# Patient Record
Sex: Female | Born: 1990 | Race: White | Hispanic: No | Marital: Married | Smoking: Never smoker
Health system: Southern US, Community
[De-identification: ages and names within clinical notes are randomized; demographics above are authoritative.]

## PROBLEM LIST (undated history)

## (undated) DIAGNOSIS — E039 Hypothyroidism, unspecified: Secondary | ICD-10-CM

## (undated) HISTORY — DX: Hypothyroidism, unspecified: E03.9

---

## 2008-02-03 HISTORY — PX: THYROIDECTOMY: SHX17

## 2012-02-03 HISTORY — PX: BANKART REPAIR: SHX5173

## 2015-02-03 HISTORY — PX: OTHER SURGICAL HISTORY: SHX169

## 2019-04-24 ENCOUNTER — Encounter: Payer: Self-pay | Admitting: Family Medicine

## 2019-04-27 ENCOUNTER — Ambulatory Visit: Payer: Self-pay | Attending: Internal Medicine

## 2019-04-27 DIAGNOSIS — Z23 Encounter for immunization: Secondary | ICD-10-CM

## 2019-04-27 NOTE — Progress Notes (Signed)
   Covid-19 Vaccination Clinic  Name:  Kathryn Fleming    MRN: [id number] DOB: May 26, 1990  04/27/2019  [name] was observed post Covid-19 immunization for 15 minutes without incident. She was provided with Vaccine Information Sheet and instruction to access the V-Safe system.   [name] was instructed to call 911 with any severe reactions post vaccine: Marland Kitchen Difficulty breathing  . Swelling of face and throat  . A fast heartbeat  . A bad rash all over body  . Dizziness and weakness   Immunizations Administered    Name Date Dose VIS Date Route   Moderna COVID-19 Vaccine 04/27/2019  8:27 AM 0.5 mL 01/03/2019 Intramuscular   Manufacturer: Moderna   Lot: [id number]   NDC: 99242-683-41

## 2019-05-31 ENCOUNTER — Ambulatory Visit: Payer: Self-pay

## 2019-06-05 ENCOUNTER — Encounter: Payer: Self-pay | Admitting: Student

## 2019-06-06 ENCOUNTER — Encounter: Payer: Self-pay | Admitting: *Deleted

## 2019-07-05 ENCOUNTER — Telehealth: Payer: Self-pay | Admitting: Student

## 2019-07-05 NOTE — Telephone Encounter (Signed)
Attempted to reach patient about making a new gyn appointment. Left a voicemail message so she can call to schedule if she still would like an appointment.

## 2019-09-27 ENCOUNTER — Other Ambulatory Visit: Payer: Self-pay

## 2019-09-27 ENCOUNTER — Other Ambulatory Visit (HOSPITAL_COMMUNITY)
Admission: RE | Admit: 2019-09-27 | Discharge: 2019-09-27 | Disposition: A | Discharge status: Home / Self Care | Payer: BC Managed Care – PPO | Source: Ambulatory Visit | Attending: Obstetrics and Gynecology | Admitting: Obstetrics and Gynecology

## 2019-09-27 ENCOUNTER — Encounter: Payer: Self-pay | Admitting: Obstetrics and Gynecology

## 2019-09-27 ENCOUNTER — Ambulatory Visit (INDEPENDENT_AMBULATORY_CARE_PROVIDER_SITE_OTHER): Payer: BC Managed Care – PPO | Admitting: Obstetrics and Gynecology

## 2019-09-27 VITALS — BP 118/71 | HR 100 | Ht 66.0 in | Wt 275.7 lb

## 2019-09-27 DIAGNOSIS — Z01419 Encounter for gynecological examination (general) (routine) without abnormal findings: Secondary | ICD-10-CM | POA: Insufficient documentation

## 2019-09-27 DIAGNOSIS — E890 Postprocedural hypothyroidism: Secondary | ICD-10-CM

## 2019-09-27 DIAGNOSIS — Z3201 Encounter for pregnancy test, result positive: Secondary | ICD-10-CM | POA: Diagnosis not present

## 2019-09-27 LAB — POCT PREGNANCY, URINE: Preg Test, Ur: POSITIVE — AB

## 2019-09-27 NOTE — Patient Instructions (Signed)
First Trimester of Pregnancy  The first trimester of pregnancy is from week 1 until the end of week 13 (months 1 through 3). During this time, your baby will begin to develop inside you. At 6-8 weeks, the eyes and face are formed, and the heartbeat can be seen on ultrasound. At the end of 12 weeks, all the baby's organs are formed. Prenatal care is all the medical care you receive before the birth of your baby. Make sure you get good prenatal care and follow all of your doctor's instructions. Follow these instructions at home: Medicines  Take over-the-counter and prescription medicines only as told by your doctor. Some medicines are safe and some medicines are not safe during pregnancy.  Take a prenatal vitamin that contains at least 600 micrograms (mcg) of folic acid.  If you have trouble pooping (constipation), take medicine that will make your stool soft (stool softener) if your doctor approves. Eating and drinking   Eat regular, healthy meals.  Your doctor will tell you the amount of weight gain that is right for you.  Avoid raw meat and uncooked cheese.  If you feel sick to your stomach (nauseous) or throw up (vomit): ? Eat 4 or 5 small meals a day instead of 3 large meals. ? Try eating a few soda crackers. ? Drink liquids between meals instead of during meals.  To prevent constipation: ? Eat foods that are high in fiber, like fresh fruits and vegetables, whole grains, and beans. ? Drink enough fluids to keep your pee (urine) clear or pale yellow. Activity  Exercise only as told by your doctor. Stop exercising if you have cramps or pain in your lower belly (abdomen) or low back.  Do not exercise if it is too hot, too humid, or if you are in a place of great height (high altitude).  Try to avoid standing for long periods of time. Move your legs often if you must stand in one place for a long time.  Avoid heavy lifting.  Wear low-heeled shoes. Sit and stand up  straight.  You can have sex unless your doctor tells you not to. Relieving pain and discomfort  Wear a good support bra if your breasts are sore.  Take warm water baths (sitz baths) to soothe pain or discomfort caused by hemorrhoids. Use hemorrhoid cream if your doctor says it is okay.  Rest with your legs raised if you have leg cramps or low back pain.  If you have puffy, bulging veins (varicose veins) in your legs: ? Wear support hose or compression stockings as told by your doctor. ? Raise (elevate) your feet for 15 minutes, 3-4 times a day. ? Limit salt in your food. Prenatal care  Schedule your prenatal visits by the twelfth week of pregnancy.  Write down your questions. Take them to your prenatal visits.  Keep all your prenatal visits as told by your doctor. This is important. Safety  Wear your seat belt at all times when driving.  Make a list of emergency phone numbers. The list should include numbers for family, friends, the hospital, and police and fire departments. General instructions  Ask your doctor for a referral to a local prenatal class. Begin classes no later than at the start of month 6 of your pregnancy.  Ask for help if you need counseling or if you need help with nutrition. Your doctor can give you advice or tell you where to go for help.  Do not use hot tubs, steam   rooms, or saunas.  Do not douche or use tampons or scented sanitary pads.  Do not cross your legs for long periods of time.  Avoid all herbs and alcohol. Avoid drugs that are not approved by your doctor.  Do not use any tobacco products, including cigarettes, chewing tobacco, and electronic cigarettes. If you need help quitting, ask your doctor. You may get counseling or other support to help you quit.  Avoid cat litter boxes and soil used by cats. These carry germs that can cause birth defects in the baby and can cause a loss of your baby (miscarriage) or stillbirth.  Visit your dentist.  At home, brush your teeth with a soft toothbrush. Be gentle when you floss. Contact a doctor if:  You are dizzy.  You have mild cramps or pressure in your lower belly.  You have a nagging pain in your belly area.  You continue to feel sick to your stomach, you throw up, or you have watery poop (diarrhea).  You have a bad smelling fluid coming from your vagina.  You have pain when you pee (urinate).  You have increased puffiness (swelling) in your face, hands, legs, or ankles. Get help right away if:  You have a fever.  You are leaking fluid from your vagina.  You have spotting or bleeding from your vagina.  You have very bad belly cramping or pain.  You gain or lose weight rapidly.  You throw up blood. It may look like coffee grounds.  You are around people who have German measles, fifth disease, or chickenpox.  You have a very bad headache.  You have shortness of breath.  You have any kind of trauma, such as from a fall or a car accident. Summary  The first trimester of pregnancy is from week 1 until the end of week 13 (months 1 through 3).  To take care of yourself and your unborn baby, you will need to eat healthy meals, take medicines only if your doctor tells you to do so, and do activities that are safe for you and your baby.  Keep all follow-up visits as told by your doctor. This is important as your doctor will have to ensure that your baby is healthy and growing well. This information is not intended to replace advice given to you by your health care provider. Make sure you discuss any questions you have with your health care provider. Document Revised: 05/12/2018 Document Reviewed: 01/28/2016 Elsevier Patient Education  2020 Elsevier Inc.  

## 2019-09-27 NOTE — Progress Notes (Signed)
   Subjective:    Patient ID: Kathryn Fleming, female    DOB: 21-Jul-1990, 29 y.o.   MRN: [id number]  HPI  29 yo G1P0 with LMP 08/24/2019 seen at [facility] with confirmed positive urine pregnancy test.  Pt will be rescheduled for formal new OB visit.  Discussion of hypothyroid secondary to thyroid resection.  Pt currently takes synthroid, but has not had labs or been followed by a practitioner for about a year.    Review of Systems     Objective:   Physical Exam SVE:  Normal appearing vagina and cervix. Pap smear taken without incident.  Cervix friable and light bleeding noted.  No abnormal discharge noted.       Assessment & Plan:   1. Gynecologic exam normal Pap smear taken - Cytology - PAP( [facility])  2. Postablative hypothyroidism Continue current dose of synthroid, will refer to endocrinology - TSH - T4 - Ambulatory referral to Endocrinology  3. Positive pregnancy test Continue prenatal vitamins New OB in 2-3 weeks Discussed appropriate antiemetics in early pregnancy.  The nature of [facility] [place] [facility] with multiple MDs and other Advanced Practice Providers was explained to patient; also emphasized that residents, students are part of our team.

## 2019-09-28 ENCOUNTER — Other Ambulatory Visit: Payer: Self-pay

## 2019-09-28 LAB — T4: T4, Total: 9.8 ug/dL (ref 4.5–12.0)

## 2019-09-28 LAB — TSH: TSH: 1.34 u[IU]/mL (ref 0.450–4.500)

## 2019-09-29 LAB — CERVICOVAGINAL ANCILLARY ONLY: HPV 16/18/45 genotyping: NEGATIVE

## 2019-10-02 LAB — CYTOLOGY - PAP
Chlamydia: NEGATIVE
Comment: NEGATIVE
Comment: NEGATIVE
Comment: NEGATIVE
Comment: NORMAL
Diagnosis: NEGATIVE
Diagnosis: REACTIVE
HPV 16: NEGATIVE
HPV 18 / 45: NEGATIVE
High risk HPV: POSITIVE — AB
Neisseria Gonorrhea: NEGATIVE

## 2019-10-20 ENCOUNTER — Ambulatory Visit (INDEPENDENT_AMBULATORY_CARE_PROVIDER_SITE_OTHER): Payer: BC Managed Care – PPO | Admitting: Obstetrics and Gynecology

## 2019-10-20 ENCOUNTER — Encounter: Payer: Self-pay | Admitting: Obstetrics and Gynecology

## 2019-10-20 ENCOUNTER — Other Ambulatory Visit: Payer: Self-pay

## 2019-10-20 VITALS — BP 104/71 | HR 87 | Wt 268.6 lb

## 2019-10-20 DIAGNOSIS — Z3A08 8 weeks gestation of pregnancy: Secondary | ICD-10-CM

## 2019-10-20 DIAGNOSIS — E039 Hypothyroidism, unspecified: Secondary | ICD-10-CM

## 2019-10-20 DIAGNOSIS — Z23 Encounter for immunization: Secondary | ICD-10-CM

## 2019-10-20 DIAGNOSIS — Z3490 Encounter for supervision of normal pregnancy, unspecified, unspecified trimester: Secondary | ICD-10-CM | POA: Insufficient documentation

## 2019-10-20 DIAGNOSIS — Z3400 Encounter for supervision of normal first pregnancy, unspecified trimester: Secondary | ICD-10-CM

## 2019-10-20 LAB — POCT URINALYSIS DIP (DEVICE)
Glucose, UA: NEGATIVE mg/dL
Ketones, ur: 80 mg/dL — AB
Leukocytes,Ua: NEGATIVE
Nitrite: NEGATIVE
Protein, ur: 30 mg/dL — AB
Specific Gravity, Urine: >=1.03 (ref 1.005–1.030)
Urobilinogen, UA: 1 mg/dL (ref 0.0–1.0)
pH: 6 (ref 5.0–8.0)

## 2019-10-20 MED ORDER — BLOOD PRESSURE KIT DEVI: 1.0000 | 0 refills | Status: DC | PRN

## 2019-10-20 NOTE — Progress Notes (Signed)
Patient still undecided about preferred pharmacy, will let us know at a later date.

## 2019-10-20 NOTE — Progress Notes (Signed)
INITIAL PRENATAL VISIT NOTE  Subjective:  Kathryn Fleming is a 29 y.o. G1P0000 at [redacted]w[redacted]d by LMP being seen today for her initial prenatal visit. This is a planned pregnancy. She and partner are happy with the pregnancy. She was using nothing for birth control previously. She has an obstetric history significant for n/a. She has a medical history significant for surgically induced hypothyrodism.  Patient reports nausea.  Contractions: Not present. Vag. Bleeding: None.  Movement: Absent. Denies leaking of fluid.   Past Medical History:  Diagnosis Date  . Hypothyroidism    Past Surgical History:  Procedure Laterality Date  . BANKART REPAIR  2014  . latarjet repair  2017  . THYROIDECTOMY  2010   OB History  Gravida Para Term Preterm AB Living  1 0 0 0 0 0  SAB TAB Ectopic Multiple Live Births  0 0 0 0 0    # Outcome Date GA Lbr Len/2nd Weight Sex Delivery Anes PTL Lv  1 Current             Social History   Socioeconomic History  . Marital status: Married    Spouse name: Not on file  . Number of children: Not on file  . Years of education: Not on file  . Highest education level: Not on file  Occupational History  . Not on file  Tobacco Use  . Smoking status: Never Smoker  . Smokeless tobacco: Never Used  Vaping Use  . Vaping Use: Never used  Substance and Sexual Activity  . Alcohol use: Not Currently  . Drug use: Never  . Sexual activity: Yes    Birth control/protection: None  Other Topics Concern  . Not on file  Social History Narrative  . Not on file   Social Determinants of Health   Financial Resource Strain:   . Difficulty of Paying Living Expenses: Not on file  Food Insecurity:   . Worried About Charity fundraiser in the Last Year: Not on file  . Ran Out of Food in the Last Year: Not on file  Transportation Needs:   . Lack of Transportation (Medical): Not on file  . Lack of Transportation (Non-Medical): Not on file  Physical Activity:   . Days of  Exercise per Week: Not on file  . Minutes of Exercise per Session: Not on file  Stress:   . Feeling of Stress : Not on file  Social Connections:   . Frequency of Communication with Friends and Family: Not on file  . Frequency of Social Gatherings with Friends and Family: Not on file  . Attends Religious Services: Not on file  . Active Member of Clubs or Organizations: Not on file  . Attends Archivist Meetings: Not on file  . Marital Status: Not on file    Family History  Problem Relation Age of Onset  . Multiple sclerosis Mother   . Hypertension Mother   . Obesity Mother   . Heart disease Father   . Diabetes Father   . Atrial fibrillation Father   . Obesity Father   . Kidney cancer Father      Current Outpatient Medications:  .  levothyroxine (SYNTHROID) 200 MCG tablet, Take 200 mcg by mouth daily before breakfast., Disp: , Rfl:  .  Prenatal Vit-Fe Fumarate-FA (PRENATAL MULTIVITAMIN) TABS tablet, Take 1 tablet by mouth daily at 12 noon., Disp: , Rfl:  .  Blood Pressure Monitoring (BLOOD PRESSURE KIT) DEVI, 1 Device by Does not apply  route as needed. Monitor BP weekly: low risk z34.90, Disp: 1 each, Rfl: 0  Allergies  Allergen Reactions  . Levothyroxine Rash   Review of Systems: Negative except for what is mentioned in HPI.  Objective:   Vitals:   10/20/19 1043  BP: 104/71  Pulse: 87  Weight: 268 lb 9.6 oz (121.8 kg)    Fetal Status: Fetal Heart Rate (bpm): 176 (U/S)   Movement: Absent     Physical Exam: BP 104/71   Pulse 87   Wt 268 lb 9.6 oz (121.8 kg)   LMP 08/24/2019 (Exact Date)   BMI 43.35 kg/m  CONSTITUTIONAL: Well-developed, well-nourished female in no acute distress.  NEUROLOGIC: Alert and oriented to person, place, and time. Normal reflexes, muscle tone coordination. No cranial nerve deficit noted. PSYCHIATRIC: Normal mood and affect. Normal behavior. Normal judgment and thought content. SKIN: Skin is warm and dry. No rash noted. Not  diaphoretic. No erythema. No pallor. HENT:  Normocephalic, atraumatic, External right and left ear normal. Oropharynx is clear and moist EYES: Conjunctivae and EOM are normal. Pupils are equal, round, and reactive to light. No scleral icterus.  NECK: Normal range of motion, supple, no masses CARDIOVASCULAR: Normal heart rate noted, regular rhythm RESPIRATORY: Effort normal, no problems with respiration noted BREASTS: not examined  ABDOMEN: Soft, nondistended GU: deferred MUSCULOSKELETAL: Normal range of motion. EXT:  No edema and no tenderness.   Assessment and Plan:  Pregnancy: G1P0000 at [redacted]w[redacted]d by LMP  1. Supervision of low-risk first pregnancy, unspecified trimester - [place] today showed CRL of 7w, will get dating [place] - Reviewed [facility] structure, multiple providers, fellows, medical students, virtual visits, MyChart.  - [facility] AMB BABYSCRIPTS OPT IN - Culture, OB Urine - CBC/D/Plt+RPR+Rh+ABO+Rub Ab... - Hemoglobin A1c - US OB Transvaginal; Future - [place] MFM OB DETAIL +14 WK; Future - doing well with unisom, call if nausea worsens - has had COVID vaccine  2. Supervision of low-risk pregnancy, unspecified trimester  3. [redacted] weeks gestation of pregnancy  4. Acquired hypothyroidism - Surgically induced - On levothyroxine 200 mcg now - She is seeing endocrine next week, normal labs currently - Will likely need increase in dose, await endo guidelines   Preterm labor symptoms and general obstetric precautions including but not limited to vaginal bleeding, contractions, leaking of fluid and fetal movement were reviewed in detail with the patient.  Please refer to After Visit Summary for other counseling recommendations.   Return in about 4 weeks (around 11/17/2019) for in person, high OB.  Sloan Leiter 10/20/2019 11:45 AM

## 2019-10-21 LAB — CBC/D/PLT+RPR+RH+ABO+RUB AB...
Antibody Screen: NEGATIVE
Basophils Absolute: 0 10*3/uL (ref 0.0–0.2)
Basos: 0 %
EOS (ABSOLUTE): 0 10*3/uL (ref 0.0–0.4)
Eos: 0 %
HCV Ab: <0.1 s/co ratio (ref 0.0–0.9)
HIV Screen 4th Generation wRfx: NONREACTIVE
Hematocrit: 45.2 % (ref 34.0–46.6)
Hemoglobin: 15.3 g/dL (ref 11.1–15.9)
Hepatitis B Surface Ag: NEGATIVE
Immature Grans (Abs): 0 10*3/uL (ref 0.0–0.1)
Immature Granulocytes: 0 %
Lymphocytes Absolute: 2 10*3/uL (ref 0.7–3.1)
Lymphs: 23 %
MCH: 30 pg (ref 26.6–33.0)
MCHC: 33.8 g/dL (ref 31.5–35.7)
MCV: 89 fL (ref 79–97)
Monocytes Absolute: 0.6 10*3/uL (ref 0.1–0.9)
Monocytes: 7 %
Neutrophils Absolute: 5.9 10*3/uL (ref 1.4–7.0)
Neutrophils: 70 %
Platelets: 366 10*3/uL (ref 150–450)
RBC: 5.1 x10E6/uL (ref 3.77–5.28)
RDW: 12.5 % (ref 11.7–15.4)
RPR Ser Ql: NONREACTIVE
Rh Factor: POSITIVE
Rubella Antibodies, IGG: 1.65 index (ref >0.99)
WBC: 8.6 10*3/uL (ref 3.4–10.8)

## 2019-10-21 LAB — HEMOGLOBIN A1C
Est. average glucose Bld gHb Est-mCnc: 103 mg/dL
Hgb A1c MFr Bld: 5.2 % (ref 4.8–5.6)

## 2019-10-21 LAB — HCV INTERPRETATION

## 2019-10-23 ENCOUNTER — Other Ambulatory Visit: Payer: Self-pay | Admitting: General Practice

## 2019-10-23 DIAGNOSIS — Z3490 Encounter for supervision of normal pregnancy, unspecified, unspecified trimester: Secondary | ICD-10-CM

## 2019-10-23 LAB — CULTURE, OB URINE

## 2019-10-23 LAB — URINE CULTURE, OB REFLEX

## 2019-10-25 ENCOUNTER — Telehealth: Payer: Self-pay | Admitting: *Deleted

## 2019-10-25 ENCOUNTER — Encounter: Payer: Self-pay | Admitting: Family Medicine

## 2019-10-25 NOTE — Telephone Encounter (Signed)
Voicemail message left by pt stating that she needs to get some dental work done. She requests a release letter to be faxed to her dentist. Fax #  [phone]. Pt also requested to be notified once the request has been completed or if it will not be possible.

## 2019-10-26 ENCOUNTER — Other Ambulatory Visit: Payer: BC Managed Care – PPO

## 2019-10-26 ENCOUNTER — Other Ambulatory Visit: Payer: Self-pay

## 2019-10-26 DIAGNOSIS — Z3490 Encounter for supervision of normal pregnancy, unspecified, unspecified trimester: Secondary | ICD-10-CM

## 2019-10-27 ENCOUNTER — Encounter: Payer: Self-pay | Admitting: Internal Medicine

## 2019-10-27 ENCOUNTER — Ambulatory Visit (INDEPENDENT_AMBULATORY_CARE_PROVIDER_SITE_OTHER): Payer: BC Managed Care – PPO | Admitting: Internal Medicine

## 2019-10-27 VITALS — BP 108/68 | HR 105 | Ht 66.0 in | Wt 266.8 lb

## 2019-10-27 DIAGNOSIS — E890 Postprocedural hypothyroidism: Secondary | ICD-10-CM | POA: Diagnosis not present

## 2019-10-27 LAB — GLUCOSE TOLERANCE, 2 HOURS W/ 1HR
Glucose, 1 hour: 113 mg/dL (ref 65–179)
Glucose, 2 hour: 101 mg/dL (ref 65–152)
Glucose, Fasting: 85 mg/dL (ref 65–91)

## 2019-10-27 NOTE — Patient Instructions (Signed)

## 2019-10-27 NOTE — Progress Notes (Signed)
Name: Kathryn Fleming  MRN/ DOB: [id number], [birth year]    Age/ Sex: 29 y.o., female    PCP: Patient, No Pcp Per   Reason for Endocrinology Evaluation: Hypothyroidism     Date of Initial Endocrinology Evaluation: 10/27/2019     HPI: Kathryn Fleming is a 29 y.o. female with a past medical history of Hypothyrodism. The patient presented for initial endocrinology clinic visit on 10/27/2019 for consultative assistance with her Hypothyroidism.     She is S/P thyroidectomy secondary to [place] in 2010 with benign pathology    Pt is 9.[redacted] weeks gestation and has sickness , not a  Lot of vomiting   Takes Synthroid, takes it appropriately , has been on Synthroid 200 mcg   She is a psych NP   HISTORY:  Past Medical History:  Past Medical History:  Diagnosis Date   Hypothyroidism    Past Surgical History:  Past Surgical History:  Procedure Laterality Date   BANKART REPAIR  2014   latarjet repair  2017   THYROIDECTOMY  2010      Social History:  reports that she has never smoked. She has never used smokeless tobacco. She reports previous alcohol use. She reports that she does not use drugs.  Family History: family history includes Atrial fibrillation in her father; Diabetes in her father; Heart disease in her father; Hypertension in her mother; Kidney cancer in her father; Multiple sclerosis in her mother; Obesity in her father and mother.   HOME MEDICATIONS: Allergies as of 10/27/2019      Reactions   Levothyroxine Rash      Medication List       Accurate as of October 27, 2019 11:59 AM. If you have any questions, ask your nurse or doctor.        Blood Pressure Kit Devi 1 Device by Does not apply route as needed. Monitor BP weekly: low risk z34.90   prenatal multivitamin Tabs tablet Take 1 tablet by mouth daily at 12 noon.   Synthroid 200 MCG tablet Generic drug: levothyroxine Take 200 mcg by mouth daily before breakfast. Take 1 tablet by mouth once daily.  Dispense as written. What changed: Another medication with the same name was removed. Continue taking this medication, and follow the directions you see here. Changed by: Dorita Sciara, MD         REVIEW OF SYSTEMS: A comprehensive ROS was conducted with the patient and is negative except as per HPI and below:  ROS     OBJECTIVE:  VS: BP 108/68 (BP Location: Left Arm, Patient Position: Sitting, Cuff Size: Normal)    Pulse (!) 105    Ht $R'5\' 6"'jR$  (1.676 m)    Wt 266 lb 12.8 oz (121 kg)    LMP 08/24/2019 (Exact Date)    SpO2 97%    BMI 43.06 kg/m    Wt Readings from Last 3 Encounters:  10/27/19 266 lb 12.8 oz (121 kg)  10/20/19 268 lb 9.6 oz (121.8 kg)  09/27/19 275 lb 11.2 oz (125.1 kg)     EXAM: General: Pt appears well and is in NAD  Neck: General: Supple without adenopathy. Thyroid: Thyroid size normal.  No goiter or nodules appreciated. No thyroid bruit.  Lungs: Clear with good BS bilat with no rales, rhonchi, or wheezes  Heart: Auscultation: RRR.  Abdomen: Normoactive bowel sounds, soft, nontender, without masses or organomegaly palpable  Extremities:  BL LE: No pretibial edema normal ROM and strength.  Skin: Hair:  Texture and amount normal with gender appropriate distribution Skin Inspection: No rashes Skin Palpation: Skin temperature, texture, and thickness normal to palpation  Neuro: Cranial nerves: II - XII grossly intact  Motor: Normal strength throughout DTRs: 2+ and symmetric in UE without delay in relaxation phase  Mental Status: Judgment, insight: Intact Orientation: Oriented to time, place, and person Mood and affect: No depression, anxiety, or agitation     DATA REVIEWED:   Results for HERO, MCCATHERN (MRN [id number]) as of 10/30/2019 12:16  Ref. Range 10/27/2019 12:06  TSH Latest Ref Range: 0.35 - 4.50 uIU/mL 0.75    ASSESSMENT/PLAN/RECOMMENDATIONS:   1. Post- surgical hypothyroidism, patient in the first trimester of pregnancy:  -Patient is  clinically euthyroid -No local neck symptoms - - Pt educated extensively on the correct way to take levothyroxine (first thing in the morning with water, 30 minutes before eating or taking other medications). - Pt encouraged to double dose the following day if she were to miss a dose given long half-life of levothyroxine. -Repeat TSH  today is normal    Medications : Continue Levothyroxine 200 MCG daily   Follow-up in 6 weeks  Signed electronically by: Mack Guise, MD  [facility] End[facility]in[facility]  [facility] [address] [place], [place] [address] Phone: [phone] FAX: [phone]   CC: Patient, No Pcp Per No address on file Phone: None Fax: None   Return to Endocrinology clinic as below: Future Appointments  Date Time Provider [place]  10/31/2019  9:00 AM [facility] US1 [facility]  11/17/2019  8:35 AM Danielle Rankin [facility]  01/04/2020  9:45 AM [facility] US4 [facility]

## 2019-10-30 ENCOUNTER — Telehealth: Payer: Self-pay

## 2019-10-30 DIAGNOSIS — E890 Postprocedural hypothyroidism: Secondary | ICD-10-CM | POA: Insufficient documentation

## 2019-10-30 LAB — TSH: TSH: 0.75 u[IU]/mL (ref 0.35–4.50)

## 2019-10-30 NOTE — Telephone Encounter (Signed)
Mailed [facility] REQ & AUTH for medical records for [facility] to pt prior endocrinologist:  Dr. Earley Brooke @ [facility].  [address] [place] [address].

## 2019-10-31 ENCOUNTER — Telehealth: Payer: Self-pay | Admitting: General Practice

## 2019-10-31 ENCOUNTER — Ambulatory Visit
Admission: RE | Admit: 2019-10-31 | Discharge: 2019-10-31 | Disposition: A | Discharge status: Home / Self Care | Payer: BC Managed Care – PPO | Source: Ambulatory Visit | Attending: Obstetrics and Gynecology | Admitting: Obstetrics and Gynecology

## 2019-10-31 ENCOUNTER — Other Ambulatory Visit: Payer: Self-pay

## 2019-10-31 ENCOUNTER — Other Ambulatory Visit: Payer: Self-pay | Admitting: Obstetrics and Gynecology

## 2019-10-31 DIAGNOSIS — O3680X0 Pregnancy with inconclusive fetal viability, not applicable or unspecified: Secondary | ICD-10-CM

## 2019-10-31 DIAGNOSIS — Z3400 Encounter for supervision of normal first pregnancy, unspecified trimester: Secondary | ICD-10-CM

## 2019-10-31 DIAGNOSIS — Z87448 Personal history of other diseases of urinary system: Secondary | ICD-10-CM

## 2019-10-31 NOTE — Progress Notes (Signed)
Ob and renal US ordered

## 2019-10-31 NOTE — Telephone Encounter (Signed)
Scheduled Ultrasound for 10/6 @ 1245. Called & informed patient. Patient verbalized understanding and stated she called last week about a dental clearance letter being faxed but she never heard back from anyone. Informed patient fax was sent and apologized no one had called her back. Patient verbalized understanding & had no questions.

## 2019-11-01 ENCOUNTER — Other Ambulatory Visit: Payer: Self-pay | Admitting: *Deleted

## 2019-11-01 MED ORDER — ONDANSETRON 4 MG PO TBDP: 4.0000 mg | ORAL_TABLET | Freq: Four times a day (QID) | ORAL | 0 refills | Status: DC | PRN

## 2019-11-08 ENCOUNTER — Other Ambulatory Visit: Payer: Self-pay | Admitting: Obstetrics and Gynecology

## 2019-11-08 ENCOUNTER — Ambulatory Visit
Admission: RE | Admit: 2019-11-08 | Discharge: 2019-11-08 | Disposition: A | Discharge status: Home / Self Care | Payer: BC Managed Care – PPO | Source: Ambulatory Visit | Attending: Obstetrics and Gynecology | Admitting: Obstetrics and Gynecology

## 2019-11-08 ENCOUNTER — Other Ambulatory Visit: Payer: Self-pay

## 2019-11-08 DIAGNOSIS — Z3400 Encounter for supervision of normal first pregnancy, unspecified trimester: Secondary | ICD-10-CM | POA: Diagnosis present

## 2019-11-08 DIAGNOSIS — Z87448 Personal history of other diseases of urinary system: Secondary | ICD-10-CM | POA: Insufficient documentation

## 2019-11-08 DIAGNOSIS — Z3490 Encounter for supervision of normal pregnancy, unspecified, unspecified trimester: Secondary | ICD-10-CM

## 2019-11-17 ENCOUNTER — Other Ambulatory Visit: Payer: Self-pay | Admitting: Obstetrics & Gynecology

## 2019-11-17 ENCOUNTER — Encounter: Payer: Self-pay | Admitting: Medical

## 2019-11-17 ENCOUNTER — Other Ambulatory Visit: Payer: Self-pay

## 2019-11-17 ENCOUNTER — Ambulatory Visit (INDEPENDENT_AMBULATORY_CARE_PROVIDER_SITE_OTHER): Payer: BC Managed Care – PPO | Admitting: Medical

## 2019-11-17 VITALS — BP 114/74 | HR 96 | Wt 258.8 lb

## 2019-11-17 DIAGNOSIS — Z3490 Encounter for supervision of normal pregnancy, unspecified, unspecified trimester: Secondary | ICD-10-CM

## 2019-11-17 DIAGNOSIS — O219 Vomiting of pregnancy, unspecified: Secondary | ICD-10-CM

## 2019-11-17 DIAGNOSIS — E890 Postprocedural hypothyroidism: Secondary | ICD-10-CM

## 2019-11-17 MED ORDER — DOCUSATE SODIUM 250 MG PO CAPS: 250.0000 mg | ORAL_CAPSULE | Freq: Every day | ORAL | 0 refills | Status: DC | PRN

## 2019-11-17 MED ORDER — ONDANSETRON 4 MG PO TBDP: 4.0000 mg | ORAL_TABLET | Freq: Four times a day (QID) | ORAL | 0 refills | Status: DC | PRN

## 2019-11-17 MED ORDER — PROMETHAZINE HCL 25 MG PO TABS: 25.0000 mg | ORAL_TABLET | Freq: Four times a day (QID) | ORAL | 1 refills | Status: DC | PRN

## 2019-11-17 MED ORDER — FAMOTIDINE 20 MG PO TABS: 20.0000 mg | ORAL_TABLET | Freq: Every day | ORAL | 1 refills | Status: DC

## 2019-11-17 MED ORDER — SCOPOLAMINE 1 MG/3DAYS TD PT72: 1.0000 | MEDICATED_PATCH | TRANSDERMAL | 2 refills | Status: DC

## 2019-11-17 MED ORDER — ONDANSETRON HCL 4 MG PO TABS: 4.0000 mg | ORAL_TABLET | Freq: Four times a day (QID) | ORAL | 2 refills | Status: DC

## 2019-11-17 NOTE — Patient Instructions (Signed)
Hyperemesis Gravidarum Hyperemesis gravidarum is a severe form of nausea and vomiting that happens during pregnancy. Hyperemesis is worse than morning sickness. It may cause you to have nausea or vomiting all day for many days. It may keep you from eating and drinking enough food and liquids, which can lead to dehydration, malnutrition, and weight loss. Hyperemesis usually occurs during the first half (the first 20 weeks) of pregnancy. It often goes away once a woman is in her second half of pregnancy. However, sometimes hyperemesis continues through an entire pregnancy. What are the causes? The cause of this condition is not known. It may be related to changes in chemicals (hormones) in the body during pregnancy, such as the high level of pregnancy hormone (human chorionic gonadotropin) or the increase in the female sex hormone (estrogen). What are the signs or symptoms? Symptoms of this condition include:  Nausea that does not go away.  Vomiting that does not allow you to keep any food down.  Weight loss.  Body fluid loss (dehydration).  Having no desire to eat, or not liking food that you have previously enjoyed. How is this diagnosed? This condition may be diagnosed based on:  A physical exam.  Your medical history.  Your symptoms.  Blood tests.  Urine tests. How is this treated? This condition is managed by controlling symptoms. This may include:  Following an eating plan. This can help lessen nausea and vomiting.  Taking prescription medicines. An eating plan and medicines are often used together to help control symptoms. If medicines do not help relieve nausea and vomiting, you may need to receive fluids through an IV at the hospital. Follow these instructions at home: Eating and drinking   Avoid the following: ? Drinking fluids with meals. Try not to drink anything during the 30 minutes before and after your meals. ? Drinking more than 1 cup of fluid at a  time. ? Eating foods that trigger your symptoms. These may include spicy foods, coffee, high-fat foods, very sweet foods, and acidic foods. ? Skipping meals. Nausea can be more intense on an empty stomach. If you cannot tolerate food, do not force it. Try sucking on ice chips or other frozen items and make up for missed calories later. ? Lying down within 2 hours after eating. ? Being exposed to environmental triggers. These may include food smells, smoky rooms, closed spaces, rooms with strong smells, warm or humid places, overly loud and noisy rooms, and rooms with motion or flickering lights. Try eating meals in a well-ventilated area that is free of strong smells. ? Quick and sudden changes in your movement. ? Taking iron pills and multivitamins that contain iron. If you take prescription iron pills, do not stop taking them unless your health care provider approves. ? Preparing food. The smell of food can spoil your appetite or trigger nausea.  To help relieve your symptoms: ? Listen to your body. Everyone is different and has different preferences. Find what works best for you. ? Eat and drink slowly. ? Eat 5-6 small meals daily instead of 3 large meals. Eating small meals and snacks can help you avoid an empty stomach. ? In the morning, before getting out of bed, eat a couple of crackers to avoid moving around on an empty stomach. ? Try eating starchy foods as these are usually tolerated well. Examples include cereal, toast, bread, potatoes, pasta, rice, and pretzels. ? Include at least 1 serving of protein with your meals and snacks. Protein options include   lean meats, poultry, seafood, beans, nuts, nut butters, eggs, cheese, and yogurt. ? Try eating a protein-rich snack before bed. Examples of a protein-rick snack include cheese and crackers or a peanut butter sandwich made with 1 slice of whole-wheat bread and 1 tsp (5 g) of peanut butter. ? Eat or suck on things that have ginger in them.  It may help relieve nausea. Add  tsp ground ginger to hot tea or choose ginger tea. ? Try drinking 100% fruit juice or an electrolyte drink. An electrolyte drink contains sodium, potassium, and chloride. ? Drink fluids that are cold, clear, and carbonated or sour. Examples include lemonade, ginger ale, lemon-lime soda, ice water, and sparkling water. ? Brush your teeth or use a mouth rinse after meals. ? Talk with your health care provider about starting a supplement of vitamin B6. General instructions  Take over-the-counter and prescription medicines only as told by your health care provider.  Follow instructions from your health care provider about eating or drinking restrictions.  Continue to take your prenatal vitamins as told by your health care provider. If you are having trouble taking your prenatal vitamins, talk with your health care provider about different options.  Keep all follow-up and pre-birth (prenatal) visits as told by your health care provider. This is important. Contact a health care provider if:  You have pain in your abdomen.  You have a severe headache.  You have vision problems.  You are losing weight.  You feel weak or dizzy. Get help right away if:  You cannot drink fluids without vomiting.  You vomit blood.  You have constant nausea and vomiting.  You are very weak.  You faint.  You have a fever and your symptoms suddenly get worse. Summary  Hyperemesis gravidarum is a severe form of nausea and vomiting that happens during pregnancy.  Making some changes to your eating habits may help relieve nausea and vomiting.  This condition may be managed with medicine.  If medicines do not help relieve nausea and vomiting, you may need to receive fluids through an IV at the hospital. This information is not intended to replace advice given to you by your health care provider. Make sure you discuss any questions you have with your health care  provider. Document Revised: 02/08/2017 Document Reviewed: 09/18/2015 Elsevier Patient Education  2020 [employer]  

## 2019-11-17 NOTE — Progress Notes (Signed)
PRENATAL VISIT NOTE  Subjective:  Kathryn Fleming is a 29 y.o. G1P0000 at [redacted]w[redacted]d being seen today for ongoing prenatal care.  She is currently monitored for the following issues for this low-risk pregnancy and has Positive pregnancy test; Supervision of low-risk pregnancy, unspecified trimester; Hypothyroidism; and Postsurgical hypothyroidism on their problem list.  Patient reports nausea, vomiting and constipation.  Contractions: Not present. Vag. Bleeding: None.  Movement: Absent. Denies leaking of fluid.   The following portions of the patient's history were reviewed and updated as appropriate: allergies, current medications, past family history, past medical history, past social history, past surgical history and problem list.   Objective:   Vitals:   11/17/19 0854  BP: 114/74  Pulse: 96  Weight: 258 lb 12.8 oz (117.4 kg)    Fetal Status: Fetal Heart Rate (bpm): 164   Movement: Absent     General:  Alert, oriented and cooperative. Patient is in no acute distress.  Skin: Skin is warm and dry. No rash noted.   Cardiovascular: Normal heart rate noted  Respiratory: Normal respiratory effort, no problems with respiration noted  Abdomen: Soft, gravid, appropriate for gestational age.  Pain/Pressure: Absent     Pelvic: Cervical exam deferred        Extremities: Normal range of motion.  Edema: None  Mental Status: Normal mood and affect. Normal behavior. Normal judgment and thought content.   Assessment and Plan:  Pregnancy: G1P0000 at [redacted]w[redacted]d 1. Supervision of low-risk pregnancy, unspecified trimester - Genetic Screening - Panorama and Horizon today  - Last [place] results reviewed. Paraovarian cyst noted. Possible low lying placenta.  - Next [place] scheduled 12/2 for anatomy and follow-up placenta   2. Postsurgical hypothyroidism - Patient established with Endocrinology and will be seen q trimester - Last thyroid testing normal - On Synthroid  3. Nausea and vomiting during pregnancy  prior to [redacted] weeks gestation - Comp Met (CMET) - Patient states history of elevated liver enzymes last year, at last check 06/2018 they returned to normal - Lipase - promethazine (PHENERGAN) 25 MG tablet; Take 1 tablet (25 mg total) by mouth every 6 (six) hours as needed for nausea or vomiting.  Dispense: 30 tablet; Refill: 1 - scopolamine (TRANSDERM-SCOP, 1.5 MG,) 1 MG/3DAYS; Place 1 patch (1.5 mg total) onto the skin every 3 (three) days.  Dispense: 24 each; Refill: 2 - ondansetron (ZOFRAN) 4 MG tablet; Take 1 tablet (4 mg total) by mouth every 6 (six) hours.  Dispense: 20 tablet; Refill: 2 - docusate sodium (COLACE) 250 MG capsule; Take 1 capsule (250 mg total) by mouth daily as needed for constipation.  Dispense: 20 capsule; Refill: 0 - famotidine (PEPCID) 20 MG tablet; Take 1 tablet (20 mg total) by mouth daily.  Dispense: 60 tablet; Refill: 1 - Diet for N/V in pregnancy discussed - Patient encouraged to call if no significant improvement in symptoms due to 21# weight loss - While taking medications on schedule she endorses vomiting 2-3x/week otherwise it is daily  - If symptoms worsen or weight loss persists consider admission for management and start of steroid taper or outpatient steroid taper depending on patient status  Preterm labor/ first trimester warning symptoms and general obstetric precautions including but not limited to vaginal bleeding, contractions, leaking of fluid and fetal movement were reviewed in detail with the patient. Please refer to After Visit Summary for other counseling recommendations.   No follow-ups on file.  Future Appointments  Date Time Provider [place]  12/13/2019 10:35 AM Ardean Larsen,  Mervyn Skeeters, CNM [facility]  12/14/2019 10:50 AM Shamleffer, Melanie Crazier, MD [id number] None  01/04/2020  9:45 AM [facility]    Kerry Hough, PA-C

## 2019-11-18 LAB — COMPREHENSIVE METABOLIC PANEL
ALT: 97 IU/L — ABNORMAL HIGH (ref 0–32)
AST: 31 IU/L (ref 0–40)
Albumin/Globulin Ratio: 1.5 (ref 1.2–2.2)
Albumin: 4.3 g/dL (ref 3.8–4.8)
Alkaline Phosphatase: 80 IU/L (ref 44–121)
BUN/Creatinine Ratio: 8 — ABNORMAL LOW (ref 9–23)
BUN: 5 mg/dL — ABNORMAL LOW (ref 6–20)
Bilirubin Total: 0.5 mg/dL (ref 0.0–1.2)
CO2: 21 mmol/L (ref 20–29)
Calcium: 9.6 mg/dL (ref 8.7–10.2)
Chloride: 102 mmol/L (ref 96–106)
Creatinine, Ser: 0.6 mg/dL (ref 0.57–1.00)
GFR calc Af Amer: 140 mL/min/{1.73_m2} (ref >59)
GFR calc non Af Amer: 122 mL/min/{1.73_m2} (ref >59)
Globulin, Total: 2.8 g/dL (ref 1.5–4.5)
Glucose: 84 mg/dL (ref 65–99)
Potassium: 4.4 mmol/L (ref 3.5–5.2)
Sodium: 135 mmol/L (ref 134–144)
Total Protein: 7.1 g/dL (ref 6.0–8.5)

## 2019-11-18 LAB — LIPASE: Lipase: 17 U/L (ref 14–72)

## 2019-11-23 ENCOUNTER — Encounter: Payer: Self-pay | Admitting: General Practice

## 2019-11-27 ENCOUNTER — Other Ambulatory Visit: Payer: Self-pay

## 2019-11-28 ENCOUNTER — Encounter: Payer: Self-pay | Admitting: General Practice

## 2019-11-30 ENCOUNTER — Telehealth: Payer: Self-pay | Admitting: Lactation Services

## 2019-11-30 DIAGNOSIS — O289 Unspecified abnormal findings on antenatal screening of mother: Secondary | ICD-10-CM

## 2019-11-30 MED ORDER — ONDANSETRON 4 MG PO TBDP: 4.0000 mg | ORAL_TABLET | Freq: Three times a day (TID) | ORAL | 2 refills | Status: DC | PRN

## 2019-11-30 NOTE — Telephone Encounter (Signed)
Spoke with Kathryn Fleming. PA about Horizon results and Genetic Counseling recommended and ordered.   Called patient to inform her of results of Horizon. 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 Reviewed this does not mean anything is abnormal with mom or baby but genetic counseling is recommended. Gave phone number for Avelina Laine for mom to call and set up Telephone. Patient agreeable to in person Genetic Counseling with MFM.   Genetic Counseling with [facility] scheduled for 11/4 at 10:30, arrive at 10:15 in [facility].   Patient asked to have Zofran refilled as she is continuing to have issues with nausea and vomiting and cannot eat if she does not take. Spoke with Dr. Mayford Knife and he agreed to refill her prescription. Order sent to pharmacy.   Patient informed of appointment date, time and location via phone. She was informed that prescription was sent to her pharmacy.

## 2019-12-04 ENCOUNTER — Encounter: Payer: Self-pay | Admitting: *Deleted

## 2019-12-07 ENCOUNTER — Ambulatory Visit: Payer: BC Managed Care – PPO

## 2019-12-07 ENCOUNTER — Ambulatory Visit: Payer: BC Managed Care – PPO | Attending: Medical | Admitting: Genetic Counselor

## 2019-12-07 ENCOUNTER — Ambulatory Visit: Payer: Self-pay | Admitting: Genetic Counselor

## 2019-12-07 ENCOUNTER — Other Ambulatory Visit: Payer: Self-pay

## 2019-12-07 DIAGNOSIS — Z315 Encounter for genetic counseling: Secondary | ICD-10-CM | POA: Diagnosis not present

## 2019-12-07 DIAGNOSIS — Z148 Genetic carrier of other disease: Secondary | ICD-10-CM

## 2019-12-07 NOTE — Progress Notes (Signed)
12/07/2019  Amiyah Shryock [birth year] MRN: [id number] DOV: 12/07/2019  Ms. 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 Ms. Imel was accompanied to her appointment by her husband, Gareth [facility].   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 Manges is a G1P0000, 29 y.o. female. Her current pregnancy has completed [redacted]w[redacted]d (Estimated Date of Delivery: 05/30/20).  Ms. Hush denied exposure to environmental toxins or chemical agents. She denied the use of alcohol, tobacco or street drugs. She reported taking prenatal vitamins, Synthroid, Zofran, Pepcid, and Promethazine. She denied significant viral illnesses, fevers, and bleeding during the course of her pregnancy. She has had a thyroidectomy for thyroid nodules. Her medical and surgical histories were otherwise noncontributory.  Family History  A three generation pedigree was drafted and reviewed. The family history is remarkable for the following:  - Ms. Bozich has a brother who began having seizures precipitated by a fall. He has not been on antiepileptic medications for 10 years. We discussed that if his seizures were directly related to his fall, then the risk for seizures in Ms. Bogard's children would likely not be greatly elevated. If, however, he has a genetic predisposition for seizures, then recurrence risk for Ms. Selders's children could be elevated. Without further information, precise risk assessment is limited.  - Ms. Lemay's brother has a daughter with Mowat-Wilson syndrome. She reportedly has seizures, developmental delays, and kidney problems. She does not have gastrointestinal features of the condition. Mowat-Wilson syndrome is a condition frequently associated with distinctive facial features, intellectual disability, delayed development, and  Hirschsprung disease. Other features can include short stature, seizures, congenital heart defects, and genitourinary abnormalities. Mowat-Wilson syndrome is an autosomal dominant condition, meaning that one copy of an altered ZEB2 gene is sufficient to cause the disorder. However, it is almost always de novo, occurring for the first time in an affected individual. Since Mowat-Wilson syndrome is usually not inherited from a healthy parent, Ms. Fomby's chance of having a child with Mowat-Wilson syndrome is not increased.  - Ms. Deschamps's sister has three sons with speech delay that required speech therapy. One of her sons also has ADHD. Another of her sons is tall, demonstrates some abnormal social behaviors, and also has seizures and tics. Given that Ms. Demars is a known premutation carrier for fragile X syndrome, we discussed that it is possible that her sister is also a carrier and her son(s) may have fragile X syndrome. However, these features may instead be caused by other unidentified factors. See Discussion section for more details.  The remaining family histories were reviewed and found to be noncontributory for birth defects, intellectual disability, recurrent pregnancy loss, and known genetic conditions.    The patient's ancestry is [place] and [place]. The father of the pregnancy's ancestry is [place]. Ashkenazi Jewish ancestry and consanguinity were denied. Pedigree will be scanned under Media.  Discussion  Fragile X syndrome:  Ms. Ostrander was referred for genetic counseling as she was identified as a carrier for fragile X syndrome on Horizon-27 carrier screening. Ms. Tien was found to carry 20 and 56 CGG repeats in the FMR1 gene, making her a premutation carrier.   Fragile X syndrome is the most common cause of inherited intellectual disability and autism. Fragile X syndrome is caused by mutations in the FMR1 gene located on the X chromosome (Xq27.3). Nearly all cases of  fragile  X syndrome are caused by mutations in the CGG trinucleotide repeat region of the FMR1 gene. Typically, the FMR1 gene contains 6 to 44 CGG trinucleotide repeats. Individuals with this number of repeats are not affected by fragile X syndrome. Individuals with fragile X syndrome have over 200 CGG repeats. This abnormally expanded CGG trinucleotide repeat segment silences the FMR1 gene, which prevents production of the FMRP protein. This protein plays a role in the development of synapses that are critical for relaying nerve impulses. Loss or deficiency of the FMRP protein leads to the symptoms associated with fragile X syndrome, including mild to moderate intellectual disability, developmental delays, autism, behavioral abnormalities, and characteristic physical features. Fragile X syndrome usually affects males more severely than females, since males have one X chromosome and females have two.   Repeat sizes of 45-54 in the CGG trinucleotide segment of the FMR1 gene are considered to be intermediate sized repeats. Repeats of this size are also commonly referred to as the "gray zone". Individuals with repeat alleles that fall in the intermediate range are not at risk of having a child affected by fragile X syndrome, as an intermediate allele is not at risk of expanding to a full mutation (>200 repeats) in one generation. Individuals who have an allele that falls in the intermediate range do not experience any symptoms themselves.   Individuals with 55-200 CGG trinucleotide repeats in the FMR1 gene are premutation carriers for fragile X syndrome. Individuals with this number of repeats are at risk for their children to inherit an expanded allele (>200 repeats) and potentially be affected by fragile X syndrome. The largest known repeat size to expand into a full mutation is 56 repeats. Given that Ms. Roddey has one allele with 20 repeats and another allele with 56 repeats, we reviewed that she has a chance  of having a child affected by fragile X syndrome. For individuals with 55-59 repeats, the chance of expansion to a full mutation in their children is ~0.5-3% (Nolin et al., 2015).  We discussed that recent literature suggests that the risk of expansion to a full mutation is influenced by another set of trinucleotides called AGG. AGG repeats within the CGG repeat region in the FMR1 gene influence the risk of expansion to a full mutation from parent to child. CGG repeat regions with no AGG interruptions have the greatest risk for full mutation expansion. Thus, AGG repeat testing is critical to accurately assess an individual's chance of having a child with a full fragile X syndrome mutation. Ms. Wannamaker's AGG repeat analysis is still pending. We discussed that if she has 0 AGG repeats, the chance of expansion into a full mutation for her children would be ~2%. If she carries 1 or more AGG repeats, the chance of expansion into a full mutation for her children would decrease significantly to <1%.   We discussed that in addition to the chance of having a child affected by fragile X syndrome, premutation carriers have health risks of their own. Female premutation carriers are at risk for fragile X-associated primary ovarian insufficiency (FXPOI) and fragile X-associated tremor/ataxia syndrome (FXTAS). FXPOI is characterized by reduced function of the ovaries. This can cause irregular menstrual cycles, early menopause, and infertility. The precise risk for FXPOI changes depending on the number of CGG repeats a premutation carrier has. For an individual with 55-79 repeats, the chance of FXPOI is ~10%. FXTAS is characterized by problems with movement and cognition after age 31 that worsen with age. Features of FXTAS  include intention tremor (trembling of a limb when performing voluntary movements) and progressive cerebellar ataxia (problems with coordination and balance). Generally female premutation carriers are at  higher risk for FXTAS than female premutation carriers. However, female premutation carriers have up to a 16.5% chance of developing FXTAS, with precise risk depending on the number of CGG repeats they carry. The risks of FXTAS for each repeat size are still being determined. Since Ms. Nobile is a premutation carrier, she is at risk for both FXPOI and FXTAS.    We reviewed that fragile X syndrome is inherited in an X-linked pattern. Typically, females have one repeat size allele for each copy of their FMR1 gene. Given that females have two X chromosomes, it is expected that females have two copies of the FMR1 gene, and thus two repeat size alleles. Since males only have one X chromosome, they only have one copy of the FMR1 gene and thus one repeat size allele. If males inherit a full mutation, that one mutation is enough to cause fragile X syndrome. Females may also inherit a full mutation and be affected by fragile X syndrome; however, females are often less severely affected by the condition than males since they generally have one normal repeat size allele that mitigates the effects of the allele with the full mutation. With each pregnancy, female premutation carriers have a 50% chance of passing on their X chromosome with the unstable FMR1 allele to their children; however, this allele will not expand into a full mutation for all offspring. Premutations in female carriers generally do not expand into full mutations for their offspring; thus, males will always have daughters who are premutation carriers and sons who are unaffected.    Ms. Belleville was encouraged to discuss her premutation carrier status with her siblings. If she inherited her premutation from her father, then her sister is guaranteed to also be a premutation carrier. If Ms. Vallier inherited her premutation from her mother, then both her brother and her sister would have a 50% chance of being premutation carriers. Her siblings may consider  undergoing FMR1 testing to determine their risk of having a child affected with fragile X syndrome or their risk for FXPOI/FXTAS. If Ms. Leano's sister is found to be a premutation carrier, she may consider pursuing testing for her children, especially her son with features that overlap with fragile X syndrome.  Ms. Weimann inquired about risks for her future grandchildren. We discussed that if her daughter(s) inherit(s) a premutation, they would have a 50% chance of passing on that unstable allele with each pregnancy. Thus, Ms. Beed future grandchildren could have up to a 50% chance of having fragile X syndrome. If Ms. Cortese has a son with fragile X syndrome who is able to reproduce, his female children would not be at risk of being affected; however, 100% of his daughters would inherit his full mutation allele and thus may be affected by some features of fragile X syndrome. Genetic counseling is recommended for Ms. Hahne's children before they considering having a family of their own someday.  Ms. Leiterman carrier screening was negative for the other 26 conditions screened. Thus, her risk to be a carrier for these additional conditions (listed separately in the laboratory report) has been reduced but not eliminated. This also significantly reduces her risk of having a child affected by one of these conditions. We discussed that since fragile X syndrome is X-linked, partner carrier screening for fragile X syndrome is not recommended.  Aneuploidy screening  results:  We also reviewed that Ms. Haskins had Panorama NIPS through the laboratory [facility] that was low-risk for fetal aneuploidies. We reviewed that these results showed a less than 1 in 10,000 risk for trisomies 21, 18 and 13, and monosomy X (Turner syndrome).  In addition, the risk for triploidy and sex chromosome trisomies (47,XXX and 47,XXY) was also low. Ms. Coxe elected to have cfDNA analysis for 22q11.2 deletion syndrome,  which was also low risk (1 in 9000). We reviewed that while this testing identifies 94-99% of pregnancies with trisomy 20, trisomy 57, trisomy 39, sex chromosome aneuploidies, and triploidy, it is NOT diagnostic. A positive test result requires confirmation by CVS or amniocentesis, and a negative test result does not rule out a fetal chromosome abnormality. She also understands that this testing does not identify all genetic conditions.  Since Panorama NIPS determined that the current fetus is a female, she has a 1 in 2 (50%) chance of inheriting Ms. Helder's X chromosome with the unstable FMR1 allele. 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  Diagnostic testing:  Ms. Frison was also counseled regarding diagnostic testing via amniocentesis available after 16 weeks' gestation. We discussed the technical aspects of the procedure and quoted up to a 1 in 500 (0.2%) risk for spontaneous pregnancy loss or other adverse pregnancy outcomes as a result of amniocentesis. Cultured cells from an amniocentesis sample allow for the visualization of a fetal karyotype, which can detect >99% of large chromosomal aberrations. Chromosomal microarray can also be performed to identify smaller deletions or duplications of fetal chromosomal material. Amniocentesis could also be performed to assess whether a baby is affected by fragile X syndrome. Ms. Voight understands that amniocentesis is available at any point after 16 weeks of pregnancy and that she may opt to undergo the procedure at any time if desired.  Plan:  Ms. Freund was informed that her AGG repeat analysis is still pending. I will follow this and call Ms. Gillson to discuss her results and updated risks for full allele expansion in detail once results become available.  I provided Ms. Segovia with several  resources. One resource was a detailed booklet discussing women's health and fragile X premutations from the [employer] I also provided Ms. Ludvigsen with information about the Early Check research study. This research study adds fragile X syndrome onto the list of genetic conditions that are screened for as a part of newborn screening. The Early Check study is free to participate in if Ms. Fancher is interested in enrolling. Finally, I informed Ms. Vanwart that I will research genetic counselors located near her sister in [place], [place] and email her with contact information so that her sister's OBGYN provider may refer her for genetic counseling/testing if interested.  I counseled Ms. Aina regarding the above risks and available options. The approximate face-to-face time with the genetic counselor was 60 minutes.  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 I will check in to see if results are back next week. These results will modify the chance of expansion into a full mutation  Reviewed low-risk NIPS result  Reduction in risk for Down  syndrome, trisomy 9, trisomy 60, triploidy, sex chromosome aneuploidies, and 22q11.2 deletion syndrome  Discussed option of amniocentesis at 55 weeks  Reviewed family history concerns  Will research genetic counselors in [place] for patient's sister   Gershon Crane, MS, [facility] Genetic Counselor

## 2019-12-11 ENCOUNTER — Other Ambulatory Visit: Payer: Self-pay

## 2019-12-13 ENCOUNTER — Encounter: Payer: Self-pay | Admitting: Student

## 2019-12-13 ENCOUNTER — Other Ambulatory Visit: Payer: Self-pay

## 2019-12-13 ENCOUNTER — Ambulatory Visit (INDEPENDENT_AMBULATORY_CARE_PROVIDER_SITE_OTHER): Payer: BC Managed Care – PPO | Admitting: Student

## 2019-12-13 VITALS — BP 115/68 | HR 83 | Wt 257.0 lb

## 2019-12-13 DIAGNOSIS — E039 Hypothyroidism, unspecified: Secondary | ICD-10-CM

## 2019-12-13 DIAGNOSIS — Z3490 Encounter for supervision of normal pregnancy, unspecified, unspecified trimester: Secondary | ICD-10-CM

## 2019-12-13 DIAGNOSIS — Z3A15 15 weeks gestation of pregnancy: Secondary | ICD-10-CM

## 2019-12-13 NOTE — Progress Notes (Signed)
° °  PRENATAL VISIT NOTE  Subjective:  Kathryn Fleming is a 29 y.o. G1P0000 at [redacted]w[redacted]d being seen today for ongoing prenatal care.  She is currently monitored for the following issues for this low-risk pregnancy and has Positive pregnancy test; Supervision of low-risk pregnancy, unspecified trimester; Hypothyroidism; and Postsurgical hypothyroidism on their problem list.  Patient reports concern for headache, can't sleep at night and wondering if she can take amoxicillin for tooth abscess.   She is on 200 mcg. She wants to have her nipple looked at because it has a small mass.   Contractions: Not present. Vag. Bleeding: None.  Movement: Absent. Denies leaking of fluid.   The following portions of the patient's history were reviewed and updated as appropriate: allergies, current medications, past family history, past medical history, past social history, past surgical history and problem list.   Objective:   Vitals:   12/13/19 1048  BP: 115/68  Pulse: 83  Weight: 257 lb (116.6 kg)    Fetal Status: Fetal Heart Rate (bpm): 160   Movement: Absent     General:  Alert, oriented and cooperative. Patient is in no acute distress.  Skin: Skin is warm and dry. No rash noted. Small white pustule on left nipple, non-tender, skin is normal temp,  Cardiovascular: Normal heart rate noted  Respiratory: Normal respiratory effort, no problems with respiration noted  Abdomen: Soft, gravid, appropriate for gestational age.  Pain/Pressure: Absent     Pelvic: Cervical exam deferred        Extremities: Normal range of motion.  Edema: None  Mental Status: Normal mood and affect. Normal behavior. Normal judgment and thought content.   Assessment and Plan:  Pregnancy: G1P0000 at [redacted]w[redacted]d 1. Supervision of low-risk pregnancy, unspecified trimester -Declines AFP -will start baby ASA (will purchase OTC) -Dr. Donavan Foil examined nipple, thinks it is just a blocked milk duct. Recommended warm compresses to relieve -take  famotidine 2 x daily for reflux -discussed exercise for insomnia, meditation, apps for calming sleep -follow up tomorrow with endocrinology, may need adjustment to thyroid meds but endo will decide tomorrow -follow up with Genetic counseling for further work-up (patient will do this) Preterm labor symptoms and general obstetric precautions including but not limited to vaginal bleeding, contractions, leaking of fluid and fetal movement were reviewed in detail with the patient. Please refer to After Visit Summary for other counseling recommendations.   Return in about 4 weeks (around 01/10/2020), or LROB.  Future Appointments  Date Time Provider Department Center  12/14/2019 10:50 AM Shamleffer, Konrad Dolores, MD [facility] None  01/04/2020  9:30 AM [facility] NURSE [facility]  01/04/2020  9:45 AM [facility] US4 [facility]  01/11/2020 10:15 AM Joetta Delprado, Charlesetta Garibaldi, CNM [facility]    Marylene Land, CNM

## 2019-12-13 NOTE — Patient Instructions (Signed)

## 2019-12-14 ENCOUNTER — Telehealth: Payer: Self-pay | Admitting: Lactation Services

## 2019-12-14 ENCOUNTER — Ambulatory Visit (INDEPENDENT_AMBULATORY_CARE_PROVIDER_SITE_OTHER): Payer: BC Managed Care – PPO | Admitting: Internal Medicine

## 2019-12-14 ENCOUNTER — Encounter: Payer: Self-pay | Admitting: Internal Medicine

## 2019-12-14 VITALS — BP 120/78 | HR 101 | Ht 66.0 in | Wt 260.0 lb

## 2019-12-14 DIAGNOSIS — E039 Hypothyroidism, unspecified: Secondary | ICD-10-CM | POA: Diagnosis not present

## 2019-12-14 DIAGNOSIS — O99282 Endocrine, nutritional and metabolic diseases complicating pregnancy, second trimester: Secondary | ICD-10-CM | POA: Diagnosis not present

## 2019-12-14 LAB — TSH: TSH: 1.47 u[IU]/mL (ref 0.35–4.50)

## 2019-12-14 NOTE — Telephone Encounter (Signed)
Reflex testing for Fragile X Carriers has returned will forward to [facility], who will follow up with Patient.

## 2019-12-14 NOTE — Progress Notes (Signed)
Name: Kathryn Fleming  MRN/ DOB: [id number], April 16, 1990    Age/ Sex: 29 y.o., female     PCP: Patient, No Pcp Per   Reason for Endocrinology Evaluation: Hypothyrodism     Initial Endocrinology Clinic Visit: 10/27/2019    PATIENT IDENTIFIER: Kathryn Fleming is a 29 y.o., female with a past medical history of hypothyroidism. She has followed with [facility] since 10/27/2019 for consultative assistance with management of her hypothyroidism   HISTORICAL SUMMARY:   She is S/P thyroidectomy secondary to [place] in 2010 with benign pathology . Has been on LT- 4 replacement since then  She is a psych NP    On her initial visit to our clinic she was pregnant in first trimester SUBJECTIVE:    Today (12/14/2019):  Kathryn Fleming is here for a follow up on hypothyroidism . She is currently at 16 weeks of gestation ( expecting a girl)   Her appetite is improving  She continues with Zofran for nausea Energy is improving  Denies local neck symptoms   Synthroid 200 MCG daily     HISTORY:  Past Medical History:  Past Medical History:  Diagnosis Date  . Hypothyroidism    Past Surgical History:  Past Surgical History:  Procedure Laterality Date  . BANKART REPAIR  2014  . latarjet repair  2017  . THYROIDECTOMY  2010    Social History:  reports that she has never smoked. She has never used smokeless tobacco. She reports previous alcohol use. She reports that she does not use drugs. Family History:  Family History  Problem Relation Age of Onset  . Multiple sclerosis Mother   . Hypertension Mother   . Obesity Mother   . Heart disease Father   . Diabetes Father   . Atrial fibrillation Father   . Obesity Father   . Kidney cancer Father      HOME MEDICATIONS: Allergies as of 12/14/2019      Reactions   Levothyroxine Rash      Medication List       Accurate as of December 14, 2019 11:08 AM. If you have any questions, ask your nurse or doctor.         Blood Pressure Kit Devi 1 Device by Does not apply route as needed. Monitor BP weekly: low risk z34.90   docusate sodium 250 MG capsule Commonly known as: COLACE Take 1 capsule (250 mg total) by mouth daily as needed for constipation.   famotidine 20 MG tablet Commonly known as: PEPCID Take 1 tablet (20 mg total) by mouth daily.   ondansetron 4 MG disintegrating tablet Commonly known as: ZOFRAN-ODT Take 1 tablet (4 mg total) by mouth every 8 (eight) hours as needed for nausea or vomiting.   prenatal multivitamin Tabs tablet Take 1 tablet by mouth daily at 12 noon.   promethazine 25 MG tablet Commonly known as: PHENERGAN Take 1 tablet (25 mg total) by mouth every 6 (six) hours as needed for nausea or vomiting.   Synthroid 200 MCG tablet Generic drug: levothyroxine Take 200 mcg by mouth daily before breakfast. Take 1 tablet by mouth once daily. Dispense as written.         OBJECTIVE:   PHYSICAL EXAM: VS: BP 120/78   Pulse (!) 101   Ht $R'5\' 6"'aK$  (1.676 m)   Wt 260 lb (117.9 kg)   LMP 08/24/2019 (Exact Date)   SpO2 98%   BMI 41.97 kg/m    EXAM: General: Pt appears well and  is in NAD  Neck: General: Supple without adenopathy. Thyroid: No goiter or nodules appreciated.   Lungs: Clear with good BS bilat with no rales, rhonchi, or wheezes  Heart: Auscultation: RRR.  Abdomen: Normoactive bowel sounds, soft, nontender, without masses or organomegaly palpable  Extremities:  BL LE: No pretibial edema normal ROM and strength.  Mental Status: Judgment, insight: Intact Memory: Intact for recent and remote events Mood and affect: No depression, anxiety, or agitation     DATA REVIEWED:  Results for NATALLIA, STELLMACH (MRN [id number]) as of 12/15/2019 07:44  Ref. Range 12/14/2019 11:26  TSH Latest Ref Range: 0.35 - 4.50 uIU/mL 1.47    ASSESSMENT / PLAN / RECOMMENDATIONS:   1. Post- surgical hypothyroidism, patient in the second  trimester of pregnancy:   - Pt is  clinically euthyroid  - No local neck symptoms  -TSH at goal for second trimester of pregnancy, no changes will be made today  Medications  Levothyroxine 200 MCG daily  Signed electronically by: Mack Guise, MD  [facility]  [facility] [address] [place], [place] [address] Phone: [phone] FAX: [phone]      CC: Patient, No Pcp Per No address on file Phone: None  Fax: None   Return to Endocrinology clinic as below: Future Appointments  Date Time Provider [place]  01/04/2020  9:30 AM [facility] NURSE [facility]  01/04/2020  9:45 AM [facility] US4 [facility]  01/11/2020 10:15 AM Ardean Larsen, Mervyn Skeeters, CNM [facility]

## 2019-12-14 NOTE — Patient Instructions (Signed)
-   Continue Synthroid 200 mcg daily

## 2019-12-15 MED ORDER — SYNTHROID 200 MCG PO TABS: 200.0000 ug | ORAL_TABLET | Freq: Every day | ORAL | 3 refills | Status: DC

## 2019-12-19 ENCOUNTER — Telehealth: Payer: Self-pay | Admitting: Genetic Counselor

## 2019-12-19 NOTE — Telephone Encounter (Signed)
I called 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 We discussed that based on this result, the chance of expansion into a full FMR1 mutation in her children is low, <1%. Per a study by Nolin et al. (2015), of 171 mother-to-child FMR1 transmissions from women with 55-59 CGG repeats and 1 AGG interruption, zero expanded into a full fragile X mutation. We reviewed that while [name]'s result cannot guarantee that an expansion into a full mutation will never happen, it is very reassuring.   I also informed [name] that I was unable to locate any prenatal genetic counselors geographically close to her sister in [place]. Given that [place] does not have genetic counseling licensure, I offered to speak with [name]'s sister about her risks for fragile X syndrome and facilitate carrier screening for her if she is interested. Ms. Bosch will provide her sister with my contact information and encourage her to reach out if she would like to learn more.   Finally, [name] informed me that her OBGYN provider had offered her MS-AFP screening for open neural tube defects (ONTDs) and requested more information about that test. We discussed that MS-AFP screening is often ordered in conjunction with a detailed anatomy ultrasound to assess for fetal ONTDs such as spina bifida. [name] was counseled that anatomy ultrasound and MS-AFP screening are able to detect ONTDs with 90-95% sensitivity. [name] declined MS-AFP screening with her OBGYN provider. I informed her that she has the option of pursuing MS-AFP screening up until 24 weeks' gestation should she wish to have screening after her ultrasound.  [name] was relieved to hear about her AGG result and confirmed that she had no further questions at this time.  Gershon Crane, MS,  [facility] Genetic Counselor

## 2020-01-04 ENCOUNTER — Ambulatory Visit: Payer: BC Managed Care – PPO | Attending: Obstetrics and Gynecology

## 2020-01-04 ENCOUNTER — Ambulatory Visit: Payer: BC Managed Care – PPO | Admitting: *Deleted

## 2020-01-04 ENCOUNTER — Encounter: Payer: Self-pay | Admitting: *Deleted

## 2020-01-04 ENCOUNTER — Other Ambulatory Visit: Payer: Self-pay

## 2020-01-04 DIAGNOSIS — Z3A19 19 weeks gestation of pregnancy: Secondary | ICD-10-CM | POA: Diagnosis not present

## 2020-01-04 DIAGNOSIS — O99212 Obesity complicating pregnancy, second trimester: Secondary | ICD-10-CM | POA: Diagnosis not present

## 2020-01-04 DIAGNOSIS — Z3400 Encounter for supervision of normal first pregnancy, unspecified trimester: Secondary | ICD-10-CM | POA: Insufficient documentation

## 2020-01-04 DIAGNOSIS — Z3490 Encounter for supervision of normal pregnancy, unspecified, unspecified trimester: Secondary | ICD-10-CM | POA: Insufficient documentation

## 2020-01-04 DIAGNOSIS — O99280 Endocrine, nutritional and metabolic diseases complicating pregnancy, unspecified trimester: Secondary | ICD-10-CM | POA: Diagnosis not present

## 2020-01-04 DIAGNOSIS — O3680X0 Pregnancy with inconclusive fetal viability, not applicable or unspecified: Secondary | ICD-10-CM | POA: Diagnosis present

## 2020-01-04 NOTE — Progress Notes (Signed)
States" I think I feel baby moving-flutters"

## 2020-01-11 ENCOUNTER — Ambulatory Visit (INDEPENDENT_AMBULATORY_CARE_PROVIDER_SITE_OTHER): Payer: BC Managed Care – PPO | Admitting: Student

## 2020-01-11 ENCOUNTER — Other Ambulatory Visit: Payer: Self-pay

## 2020-01-11 VITALS — BP 94/58 | HR 91 | Wt 258.1 lb

## 2020-01-11 DIAGNOSIS — Z3A20 20 weeks gestation of pregnancy: Secondary | ICD-10-CM

## 2020-01-11 DIAGNOSIS — Z3490 Encounter for supervision of normal pregnancy, unspecified, unspecified trimester: Secondary | ICD-10-CM

## 2020-01-11 NOTE — Progress Notes (Signed)
   PRENATAL VISIT NOTE  Subjective:  Kathryn Fleming is a 29 y.o. G1P0000 at [redacted]w[redacted]d being seen today for ongoing prenatal care.  She is currently monitored for the following issues for this low-risk pregnancy and has Positive pregnancy test; Supervision of low-risk pregnancy, unspecified trimester; Hypothyroidism; Postsurgical hypothyroidism; and Hypothyroidism affecting pregnancy in second trimester on their problem list.  Patient reports no complaints. She is s/p abx for dental abscess. SHe still takes zofran twice a day. Now started colace. Taking pepcid twice a day. Has not started taking baby ASA.  Contractions: Not present. Vag. Bleeding: None.  Movement: Present. Denies leaking of fluid.   The following portions of the patient's history were reviewed and updated as appropriate: allergies, current medications, past family history, past medical history, past social history, past surgical history and problem list.   Objective:   Vitals:   01/11/20 1026  BP: (!) 94/58  Pulse: 91  Weight: 258 lb 1.6 oz (117.1 kg)    Fetal Status: Fetal Heart Rate (bpm): 163 Fundal Height: 20 cm Movement: Present     General:  Alert, oriented and cooperative. Patient is in no acute distress.  Skin: Skin is warm and dry. No rash noted.   Cardiovascular: Normal heart rate noted  Respiratory: Normal respiratory effort, no problems with respiration noted  Abdomen: Soft, gravid, appropriate for gestational age.  Pain/Pressure: Present     Pelvic: Cervical exam deferred        Extremities: Normal range of motion.  Edema: None  Mental Status: Normal mood and affect. Normal behavior. Normal judgment and thought content.   Assessment and Plan:  Pregnancy: G1P0000 at [redacted]w[redacted]d  1. [redacted] weeks gestation of pregnancy   2. Supervision of low-risk pregnancy, unspecified trimester    2. Weight stabilized,not losing as much weight.  -will start taking baby ASA 3. Patient TSH WNL per Endocrinology note; she has follow  up in 3 months. Endo will manage.     Preterm labor symptoms and general obstetric precautions including but not limited to vaginal bleeding, contractions, leaking of fluid and fetal movement were reviewed in detail with the patient. Please refer to After Visit Summary for other counseling recommendations.   Return in about 4 weeks (around 02/08/2020), or LROB with APP.  Future Appointments  Date Time Provider Department Center  02/08/2020  1:55 PM Madlyn Frankel [facility]  03/20/2020 10:10 AM Shamleffer, Konrad Dolores, MD [facility] None    Marylene Land, [place]

## 2020-02-03 NOTE — L&D Delivery Note (Signed)
Delivery Note Progressed spontaneously w/o augmentation.  SROM around 2350. Pushed about 30 minutes. At 2:51 AM a viable female was delivered via Vaginal, Spontaneous (Presentation: Left Occiput Anterior).  APGAR: 9, 9; weight pending.  After 1 minute, the cord was clamped and cut. 40 units of pitocin diluted in 1000cc LR was infused rapidly IV.  The placenta separated spontaneously and delivered via CCT and maternal pushing effort.  It was inspected and appears to be intact with a 3 VC.   Anesthesia: Epidural Episiotomy: None Lacerations: 2nd degree Suture Repair: 2.0 vicryl rapide Est. Blood Loss (mL): 200  Mom to postpartum.  Baby to Couplet care / Skin to Skin.  Jacklyn Shell 05/31/2020, 3:36 AM

## 2020-02-06 ENCOUNTER — Encounter: Payer: Self-pay | Admitting: Family Medicine

## 2020-02-07 DIAGNOSIS — O285 Abnormal chromosomal and genetic finding on antenatal screening of mother: Secondary | ICD-10-CM | POA: Insufficient documentation

## 2020-02-07 HISTORY — DX: Abnormal chromosomal and genetic finding on antenatal screening of mother: O28.5

## 2020-02-08 ENCOUNTER — Other Ambulatory Visit: Payer: Self-pay

## 2020-02-08 ENCOUNTER — Telehealth (INDEPENDENT_AMBULATORY_CARE_PROVIDER_SITE_OTHER): Payer: BC Managed Care – PPO | Admitting: Student

## 2020-02-08 DIAGNOSIS — O285 Abnormal chromosomal and genetic finding on antenatal screening of mother: Secondary | ICD-10-CM

## 2020-02-08 DIAGNOSIS — Z3A24 24 weeks gestation of pregnancy: Secondary | ICD-10-CM

## 2020-02-08 DIAGNOSIS — K5900 Constipation, unspecified: Secondary | ICD-10-CM

## 2020-02-08 DIAGNOSIS — O99612 Diseases of the digestive system complicating pregnancy, second trimester: Secondary | ICD-10-CM

## 2020-02-08 DIAGNOSIS — Z3490 Encounter for supervision of normal pregnancy, unspecified, unspecified trimester: Secondary | ICD-10-CM

## 2020-02-08 HISTORY — DX: Constipation, unspecified: K59.00

## 2020-02-08 MED ORDER — HYDROCORTISONE (PERIANAL) 2.5 % EX CREA: 1.0000 "application " | TOPICAL_CREAM | Freq: Two times a day (BID) | CUTANEOUS | 1 refills | Status: DC

## 2020-02-08 NOTE — Progress Notes (Signed)
I connected with  Kathryn Fleming on 02/08/20 at 2:05 by telephone and verified that I am speaking with the correct person using two identifiers. [name] and I attempted to connect virtually several times and were unsuccessful.    I discussed the limitations, risks, security and privacy concerns of performing an evaluation and management service by telephone and the availability of in person appointments. I also discussed with the patient that there may be a patient responsible charge related to this service. The patient expressed understanding and agreed to proceed.  Taking colace and miralax.  Has not gotten a blood pressure cuff yet. Advised to take metamucil , fibercon, or citracil  Daily and miralax as last resort only as needed per protocol.  Advised next visit will do fasting 2 hour gtt with ob  Visit.  [name],RN 02/08/2020  2:07 PM

## 2020-02-08 NOTE — Progress Notes (Signed)
Patient ID: Kathryn Fleming, female   DOB: 12-May-1990, 30 y.o.   MRN: [id number] I connected with Jaye Saal 02/08/20 at  1:55 PM EST by: MyChart video and verified that I am speaking with the correct person using two identifiers.  Patient is located in car and provider is located at .     The purpose of this virtual visit is to provide medical care while limiting exposure to the novel coronavirus. I discussed the limitations, risks, security and privacy concerns of performing an evaluation and management service by MyChart video and the availability of in person appointments. I also discussed with the patient that there may be a patient responsible charge related to this service. By engaging in this virtual visit, you consent to the provision of healthcare.  Additionally, you authorize for your insurance to be billed for the services provided during this visit.  The patient expressed understanding and agreed to proceed.  The following staff members participated in the virtual visit:  Raynald Blend    PRENATAL VISIT NOTE  Subjective:  Kathryn Fleming is a 30 y.o. G1P0000 at [redacted]w[redacted]d  for phone visit for ongoing prenatal care. Patient is pulled over at a rest stop currently en route to [place] for a family gathering. She has not bought a BP cuff yet.   She is currently monitored for the following issues for this low-risk pregnancy and has questions about safety of augmentin in pregnancy, as well as when to get COVID-19 vaccination and how to relieve hemorrhoids. She  has Positive pregnancy test; Supervision of low-risk pregnancy, unspecified trimester; Hypothyroidism; Postsurgical hypothyroidism; Hypothyroidism affecting pregnancy in second trimester; Abnormal chromosomal and genetic finding on antenatal screening mother; and Constipation on their problem list.  Patient reports that she is constipated.  Contractions: Not present. Vag. Bleeding: None.  Movement: Present. Denies leaking of fluid.    The following portions of the patient's history were reviewed and updated as appropriate: allergies, current medications, past family history, past medical history, past social history, past surgical history and problem list.   Objective:  There were no vitals filed for this visit. Self-Obtained  Fetal Status:     Movement: Present     Assessment and Plan:  Pregnancy: G1P0000 at [redacted]w[redacted]d 1. Abnormal chromosomal and genetic finding on antenatal screening mother   2. Supervision of low-risk pregnancy, unspecified trimester  -has signed up for breast feeding class, waterbirth class.  -recommended that patient try anusol and sitz bath, can prescribe nitrogylcerin cream if it becomes necessary for hemorroids.  -recommened walking 20 minutres every day -get COVID booster now; no data available yet on when to have COVID booster in pregnancy but given that she is pregnant now, would recommend that she get vaccine in order to be protected.    Preterm labor symptoms and general obstetric precautions including but not limited to vaginal bleeding, contractions, leaking of fluid and fetal movement were reviewed in detail with the patient.  Return in about 4 weeks (around 03/07/2020), or two hour gtt and LROB with APP.  Future Appointments  Date Time Provider Department Center  03/20/2020 10:10 AM Shamleffer, Konrad Dolores, MD [facility] None     Time spent on virtual visit: 20 minutes  Marylene Land, CNM

## 2020-03-06 ENCOUNTER — Other Ambulatory Visit: Payer: Self-pay | Admitting: *Deleted

## 2020-03-06 DIAGNOSIS — Z3490 Encounter for supervision of normal pregnancy, unspecified, unspecified trimester: Secondary | ICD-10-CM

## 2020-03-11 ENCOUNTER — Other Ambulatory Visit: Payer: BC Managed Care – PPO

## 2020-03-11 ENCOUNTER — Other Ambulatory Visit: Payer: Self-pay

## 2020-03-11 ENCOUNTER — Ambulatory Visit (INDEPENDENT_AMBULATORY_CARE_PROVIDER_SITE_OTHER): Payer: BC Managed Care – PPO | Admitting: Nurse Practitioner

## 2020-03-11 VITALS — BP 95/53 | HR 93 | Wt 259.0 lb

## 2020-03-11 DIAGNOSIS — U071 COVID-19: Secondary | ICD-10-CM | POA: Insufficient documentation

## 2020-03-11 DIAGNOSIS — E039 Hypothyroidism, unspecified: Secondary | ICD-10-CM

## 2020-03-11 DIAGNOSIS — Z3490 Encounter for supervision of normal pregnancy, unspecified, unspecified trimester: Secondary | ICD-10-CM | POA: Diagnosis not present

## 2020-03-11 DIAGNOSIS — Z23 Encounter for immunization: Secondary | ICD-10-CM

## 2020-03-11 DIAGNOSIS — O98519 Other viral diseases complicating pregnancy, unspecified trimester: Secondary | ICD-10-CM

## 2020-03-11 DIAGNOSIS — B370 Candidal stomatitis: Secondary | ICD-10-CM | POA: Insufficient documentation

## 2020-03-11 DIAGNOSIS — Z3A28 28 weeks gestation of pregnancy: Secondary | ICD-10-CM

## 2020-03-11 DIAGNOSIS — Z6841 Body Mass Index (BMI) 40.0 and over, adult: Secondary | ICD-10-CM | POA: Insufficient documentation

## 2020-03-11 HISTORY — DX: COVID-19: U07.1

## 2020-03-11 HISTORY — DX: Candidal stomatitis: B37.0

## 2020-03-11 HISTORY — DX: Other viral diseases complicating pregnancy, unspecified trimester: O98.519

## 2020-03-11 MED ORDER — NYSTATIN 100000 UNIT/ML MT SUSP: 5.0000 mL | Freq: Four times a day (QID) | OROMUCOSAL | 0 refills | Status: AC

## 2020-03-11 NOTE — Progress Notes (Signed)
Subjective:  Kathryn Fleming is a 30 y.o. G1P0000 at [redacted]w[redacted]d being seen today for ongoing prenatal care.  She is currently monitored for the following issues for this low-risk pregnancy and has Supervision of low-risk pregnancy, unspecified trimester; Hypothyroidism; Postsurgical hypothyroidism; Hypothyroidism affecting pregnancy in second trimester; Abnormal chromosomal and genetic finding on antenatal screening mother; Constipation; COVID-19 affecting pregnancy, antepartum; BMI 45.0-49.9, adult (HCC); and Thrush on their problem list.  Patient reports feels very poorly today - fatigue and lethargy.  Contractions: Irritability. Vag. Bleeding: None.  Movement: Present. Denies leaking of fluid.   The following portions of the patient's history were reviewed and updated as appropriate: allergies, current medications, past family history, past medical history, past social history, past surgical history and problem list. Problem list updated.  Objective:   Vitals:   03/11/20 1022  BP: (!) 95/53  Pulse: 93  Weight: 259 lb (117.5 kg)    Fetal Status: Fetal Heart Rate (bpm): 148 Fundal Height: 28 cm Movement: Present     General:  Alert, oriented and cooperative. Patient is in no acute distress.  Skin: Skin is warm and dry. No rash noted.   Cardiovascular: Normal heart rate noted  Respiratory: Normal respiratory effort, no problems with respiration noted  Abdomen: Soft, gravid, appropriate for gestational age. Pain/Pressure: Present     Pelvic:  Cervical exam deferred        Extremities: Normal range of motion.  Edema: None  Mental Status: Normal mood and affect. Normal behavior. Normal judgment and thought content.   Urinalysis:      Assessment and Plan:  Pregnancy: G1P0000 at [redacted]w[redacted]d  1. Supervision of low-risk pregnancy, unspecified trimester Did not get BP cuff due to cost Is very tired - possibly due to longer symptoms from Covid and also today has not eaten since midnight. Has had  vomiting so she is still not back at her prepregnancy weight - has lost about 21 pounds.  Weigh has stablized but is not gaining weight.  Reports 253 was her lowest weight. Is not vomiting regularly now.  Is able to eat.   Declined serial [place] due to her insurance not covering the costs. Describes a tightening of th uterus lasting about 15 minutes last night where her partner had to put counter pressure on her low back.  Is not having this today and only had for a couple of timed during the night. Asked about the possibility of preterm labor since she has had Covid. Drink at least 8 8-oz glasses of water every day. If contractions are more than 4 in an hour, drink 2 glasses of water and rest. Seek care if contractions do not stop. 28 week labs done today Advised to eat as soon as labs are completed.  - Tdap vaccine greater than or equal to 7yo IM  2. COVID-19 affecting pregnancy, antepartum   3. Acquired hypothyroidism Sees endocrinologist  4. Thrush Has had dental problems with root canal and then tooth extraction followed by oral antibiotics.  Now tongue has white coating classic for thrush. Prescribed Nystatin suspension for rinse in her mouth.  Preterm labor symptoms and general obstetric precautions including but not limited to vaginal bleeding, contractions, leaking of fluid and fetal movement were reviewed in detail with the patient. Please refer to After Visit Summary for other counseling recommendations.  Return in about 2 weeks (around 03/25/2020) for ROB in person with CNM for waterbirth info.  Nolene Bernheim, RN, MSN, NP-BC Nurse Practitioner, [facility], [facility] 03/11/2020 1:06 PM

## 2020-03-12 LAB — CBC
Hematocrit: 39.5 % (ref 34.0–46.6)
Hemoglobin: 13.4 g/dL (ref 11.1–15.9)
MCH: 31.3 pg (ref 26.6–33.0)
MCHC: 33.9 g/dL (ref 31.5–35.7)
MCV: 92 fL (ref 79–97)
Platelets: 331 10*3/uL (ref 150–450)
RBC: 4.28 x10E6/uL (ref 3.77–5.28)
RDW: 12.1 % (ref 11.7–15.4)
WBC: 11.6 10*3/uL — ABNORMAL HIGH (ref 3.4–10.8)

## 2020-03-12 LAB — GLUCOSE TOLERANCE, 2 HOURS W/ 1HR
Glucose, 1 hour: 109 mg/dL (ref 65–179)
Glucose, 2 hour: 74 mg/dL (ref 65–152)
Glucose, Fasting: 77 mg/dL (ref 65–91)

## 2020-03-12 LAB — RPR: RPR Ser Ql: NONREACTIVE

## 2020-03-12 LAB — HIV ANTIBODY (ROUTINE TESTING W REFLEX): HIV Screen 4th Generation wRfx: NONREACTIVE

## 2020-03-19 ENCOUNTER — Other Ambulatory Visit: Payer: Self-pay

## 2020-03-20 ENCOUNTER — Ambulatory Visit (INDEPENDENT_AMBULATORY_CARE_PROVIDER_SITE_OTHER): Payer: BC Managed Care – PPO | Admitting: Internal Medicine

## 2020-03-20 VITALS — BP 100/70 | HR 83 | Ht 66.0 in | Wt 259.2 lb

## 2020-03-20 DIAGNOSIS — O99283 Endocrine, nutritional and metabolic diseases complicating pregnancy, third trimester: Secondary | ICD-10-CM | POA: Diagnosis not present

## 2020-03-20 DIAGNOSIS — E039 Hypothyroidism, unspecified: Secondary | ICD-10-CM | POA: Diagnosis not present

## 2020-03-20 LAB — TSH: TSH: 2.76 u[IU]/mL (ref 0.35–4.50)

## 2020-03-20 NOTE — Progress Notes (Signed)
Name: Kathryn Fleming  MRN/ DOB: [id number], 10-22-1990    Age/ Sex: 30 y.o., female     PCP: Patient, No Pcp Per   Reason for Endocrinology Evaluation: Hypothyrodism     Initial Endocrinology Clinic Visit: 10/27/2019    PATIENT IDENTIFIER: [name] is a 30 y.o., female with a past medical history of hypothyroidism. She has followed with [place] [facility] since 10/27/2019 for consultative assistance with management of her hypothyroidism      HISTORICAL SUMMARY:   She is S/P thyroidectomy secondary to [place] in 2010 with benign pathology . Has been on LT- 4 replacement since then  She is a psych NP    On her initial visit to our clinic she was pregnant in first trimester SUBJECTIVE:    Today (03/20/2020):  Kathryn Fleming is here for a follow up on hypothyroidism . She is currently at 29 weeks of gestation ( expecting a girl)    EDD 05/30/2020   Weight has been stable  Has nausea  Has been having constipation  Denies local neck symptoms  No biotin use     Synthroid 200 MCG daily     HISTORY:  Past Medical History:  Past Medical History:  Diagnosis Date  . Hypothyroidism    Past Surgical History:  Past Surgical History:  Procedure Laterality Date  . BANKART REPAIR  2014  . latarjet repair  2017  . THYROIDECTOMY  2010    Social History:  reports that she has never smoked. She has never used smokeless tobacco. She reports previous alcohol use. She reports that she does not use drugs. Family History:  Family History  Problem Relation Age of Onset  . Multiple sclerosis Mother   . Hypertension Mother   . Obesity Mother   . Heart disease Father   . Diabetes Father   . Atrial fibrillation Father   . Obesity Father   . Kidney cancer Father      HOME MEDICATIONS: Allergies as of 03/20/2020      Reactions   Levothyroxine Rash      Medication List       Accurate as of March 20, 2020 10:28 AM. If you have any questions, ask your  nurse or doctor.        acetaminophen 500 MG tablet Commonly known as: TYLENOL Take 500 mg by mouth every 6 (six) hours as needed.   Blood Pressure Kit Devi 1 Device by Does not apply route as needed. Monitor BP weekly: low risk z34.90   famotidine 20 MG tablet Commonly known as: PEPCID Take 1 tablet (20 mg total) by mouth daily.   ondansetron 4 MG disintegrating tablet Commonly known as: ZOFRAN-ODT Take 1 tablet (4 mg total) by mouth every 8 (eight) hours as needed for nausea or vomiting.   prenatal multivitamin Tabs tablet Take 1 tablet by mouth daily at 12 noon.   Synthroid 200 MCG tablet Generic drug: levothyroxine Take 1 tablet (200 mcg total) by mouth daily before breakfast.         OBJECTIVE:   PHYSICAL EXAM: VS: BP 100/70   Pulse 83   Ht $R'5\' 6"'hx$  (1.676 m)   Wt 259 lb 4 oz (117.6 kg)   LMP 08/24/2019 (Exact Date)   SpO2 98%   BMI 41.84 kg/m    EXAM: General: Pt appears well and is in NAD  Neck: General: Supple without adenopathy. Thyroid: No goiter or nodules appreciated.   Lungs: Clear with good BS bilat with no  rales, rhonchi, or wheezes  Heart: Auscultation: RRR.  Abdomen: Normoactive bowel sounds, soft, nontender, without masses or organomegaly palpable  Extremities:  BL LE: No pretibial edema normal ROM and strength.  Mental Status: Judgment, insight: Intact Memory: Intact for recent and remote events Mood and affect: No depression, anxiety, or agitation     DATA REVIEWED: Results for YAJAIRA, DOFFING (MRN [id number]) as of 03/21/2020 12:32  Ref. Range 03/20/2020 10:41  TSH Latest Ref Range: 0.35 - 4.50 uIU/mL 2.76    ASSESSMENT / PLAN / RECOMMENDATIONS:   1. Post- surgical hypothyroidism, patient in the third trimester of pregnancy:   - Pt is clinically euthyroid  - No local neck symptoms  -TSH is trending up and since she is still at the beginning of the 3rd trimester , I expect this to continue to increase, will increase LT-4  replacement slightly and recheck in 4 weeks   Medications  Levothyroxine 200 , 1.5 tabs on Sundays and 1 tablet the rest of the week    F/u in 4 months Labs in 4 weeks   Signed electronically by: Mack Guise, MD  [facility]  [facility] [address] [place], [place] [address] Phone: [phone] FAX: [phone]      CC: Patient, No Pcp Per No address on file Phone: None  Fax: None   Return to Endocrinology clinic as below: Future Appointments  Date Time Provider [place]  03/26/2020  3:55 PM Clarnce Flock, MD [facility]

## 2020-03-20 NOTE — Patient Instructions (Signed)
-   Please stop by the lab today  

## 2020-03-21 MED ORDER — SYNTHROID 200 MCG PO TABS: 200.0000 ug | ORAL_TABLET | ORAL | 1 refills | Status: DC

## 2020-03-25 ENCOUNTER — Other Ambulatory Visit: Payer: Self-pay | Admitting: Internal Medicine

## 2020-03-25 MED ORDER — SYNTHROID 200 MCG PO TABS: 200.0000 ug | ORAL_TABLET | Freq: Every day | ORAL | 1 refills | Status: DC

## 2020-03-25 NOTE — Telephone Encounter (Signed)
received fax that [employer]. Need frequency in to proceeded with refill.  Updated Rx and resent

## 2020-03-26 ENCOUNTER — Encounter: Payer: BC Managed Care – PPO | Admitting: Family Medicine

## 2020-03-28 ENCOUNTER — Ambulatory Visit (INDEPENDENT_AMBULATORY_CARE_PROVIDER_SITE_OTHER): Payer: BC Managed Care – PPO | Admitting: Student

## 2020-03-28 ENCOUNTER — Other Ambulatory Visit: Payer: Self-pay

## 2020-03-28 VITALS — BP 118/74 | HR 98 | Wt 262.0 lb

## 2020-03-28 DIAGNOSIS — Z3A31 31 weeks gestation of pregnancy: Secondary | ICD-10-CM

## 2020-03-28 DIAGNOSIS — Z3490 Encounter for supervision of normal pregnancy, unspecified, unspecified trimester: Secondary | ICD-10-CM

## 2020-03-28 DIAGNOSIS — U071 COVID-19: Secondary | ICD-10-CM

## 2020-03-28 DIAGNOSIS — O98519 Other viral diseases complicating pregnancy, unspecified trimester: Secondary | ICD-10-CM

## 2020-03-28 NOTE — Progress Notes (Signed)
° °  PRENATAL VISIT NOTE  Subjective:  Kathryn Fleming is a 30 y.o. G1P0000 at [redacted]w[redacted]d being seen today for ongoing prenatal care.  She is currently monitored for the following issues for this low-risk pregnancy and has Supervision of low-risk pregnancy, unspecified trimester; Hypothyroidism; Postsurgical hypothyroidism; Hypothyroidism affecting pregnancy in second trimester; Abnormal chromosomal and genetic finding on antenatal screening mother; Constipation; COVID-19 affecting pregnancy, antepartum; BMI 45.0-49.9, adult (HCC); and Thrush on their problem list.  Patient reports no physical complaints. She is concerned about HIV testing/consent not explicitly given for testing at 28 weeks. Patient also concerned about mask policy and waterbirth weight limits. She is concerned that her weight may exceed the birth tub limit. She shared other concerns about her prior visit including medication management for her oral thrush (patient has not yet started taking).   Contractions: Irritability. Vag. Bleeding: None.  Movement: Present. Denies leaking of fluid.   The following portions of the patient's history were reviewed and updated as appropriate: allergies, current medications, past family history, past medical history, past social history, past surgical history and problem list.   Objective:   Vitals:   03/28/20 1529  BP: 118/74  Pulse: 98  Weight: 262 lb (118.8 kg)    Fetal Status: Fetal Heart Rate (bpm): 143 Fundal Height: 34 cm Movement: Present     General:  Alert, oriented and cooperative. Patient is in no acute distress.  Skin: Skin is warm and dry. No rash noted.   Cardiovascular: Normal heart rate noted  Respiratory: Normal respiratory effort, no problems with respiration noted  Abdomen: Soft, gravid, appropriate for gestational age.  Pain/Pressure: Present     Pelvic: Cervical exam deferred        Extremities: Normal range of motion.  Edema: None  Mental Status: Normal mood and affect.  Normal behavior. Normal judgment and thought content.   Assessment and Plan:  Pregnancy: G1P0000 at [redacted]w[redacted]d 1. Supervision of low-risk pregnancy, unspecified trimester -discussed HIV consent; I will discuss with clinic staff regarding consent protocol for HIV testing -I will discuss with waterbirth committee the weight limit;  Explained that, to my knowledge, we have not used weight as an exclusion factor. Discussed that it is important to be able to get in and out of the tub in an emergency. Midwives must be comfortable with patient's physical ability to get in and out of the tub in order to have a waterbirth.  -today fundal height is measuring large, recommended [place] if FH measures ahead at next visit. Patient is concerned about cost and would like to avoid growth scan if possible -will get clarity on masking while in labor -discussed management for thrush; explained that we do not routinely give Diflucan in pregnancy and encouraged patient to take oral nystatin.   Preterm labor symptoms and general obstetric precautions including but not limited to vaginal bleeding, contractions, leaking of fluid and fetal movement were reviewed in detail with the patient. Please refer to After Visit Summary for other counseling recommendations.   Return in about 3 weeks (around 04/18/2020), or for in person visit with KK.  Future Appointments  Date Time Provider Department Center  04/18/2020 10:35 AM Bernerd Limbo, CNM [facility]  04/22/2020 11:00 AM [facility] None  07/24/2020 10:30 AM Shamleffer, Konrad Dolores, MD [facility] None    Marylene Land, [place]

## 2020-04-03 ENCOUNTER — Telehealth: Payer: Self-pay | Admitting: Student

## 2020-04-03 NOTE — Telephone Encounter (Signed)
Called patient and left VM regarding some of her concerns from last visit. Will try again later on this afternoon.   Kathryn Fleming

## 2020-04-18 ENCOUNTER — Ambulatory Visit (INDEPENDENT_AMBULATORY_CARE_PROVIDER_SITE_OTHER): Payer: BC Managed Care – PPO | Admitting: Certified Nurse Midwife

## 2020-04-18 ENCOUNTER — Other Ambulatory Visit: Payer: Self-pay

## 2020-04-18 VITALS — BP 109/83 | HR 111 | Wt 263.6 lb

## 2020-04-18 DIAGNOSIS — Z3403 Encounter for supervision of normal first pregnancy, third trimester: Secondary | ICD-10-CM

## 2020-04-18 DIAGNOSIS — Z3A34 34 weeks gestation of pregnancy: Secondary | ICD-10-CM

## 2020-04-18 NOTE — Patient Instructions (Signed)

## 2020-04-18 NOTE — Progress Notes (Signed)
   PRENATAL VISIT NOTE  Subjective:  Kathryn Fleming is a 30 y.o. G1P0000 at [redacted]w[redacted]d being seen today for ongoing prenatal care.  She is currently monitored for the following issues for this low-risk pregnancy and has Supervision of low-risk pregnancy, unspecified trimester; Hypothyroidism; Postsurgical hypothyroidism; Hypothyroidism affecting pregnancy in second trimester; Abnormal chromosomal and genetic finding on antenatal screening mother; Constipation; COVID-19 affecting pregnancy, antepartum; BMI 45.0-49.9, adult (HCC); and Thrush on their problem list.  Patient reports no complaints. Feeling well overall, just increasing pelvic pressure when walking, but not bothersome.  Contractions: Irritability. Vag. Bleeding: None.  Movement: Present. Denies leaking of fluid.   The following portions of the patient's history were reviewed and updated as appropriate: allergies, current medications, past family history, past medical history, past social history, past surgical history and problem list.   Objective:   Vitals:   04/18/20 1103  BP: 109/83  Pulse: (!) 111  Weight: 263 lb 9.6 oz (119.6 kg)   Fetal Status: Fetal Heart Rate (bpm): 155 Fundal Height: 34 cm Movement: Present     General:  Alert, oriented and cooperative. Patient is in no acute distress.  Skin: Skin is warm and dry. No rash noted.   Cardiovascular: Normal heart rate noted  Respiratory: Normal respiratory effort, no problems with respiration noted  Abdomen: Soft, gravid, appropriate for gestational age.  Pain/Pressure: Present     Pelvic: Cervical exam deferred        Extremities: Normal range of motion.  Edema: None  Mental Status: Normal mood and affect. Normal behavior. Normal judgment and thought content.   Assessment and Plan:  Pregnancy: G1P0000 at [redacted]w[redacted]d 1. Encounter for supervision of low-risk first pregnancy in third trimester - Pt doing well, no complaints  2. [redacted] weeks gestation of pregnancy - Measures S=D  today, pt confused as to measuring discrepancy from last visit. Explained that 31wks is right after a large growth spurt and fluid volume increase that is transient and usually resolves. Reassured that since she measures well, baby's movement is vigorous and she is feeling well we should just trend her growth at next visit. Pt expressed relief and agreed to plan.  Preterm labor symptoms and general obstetric precautions including but not limited to vaginal bleeding, contractions, leaking of fluid and fetal movement were reviewed in detail with the patient. Please refer to After Visit Summary for other counseling recommendations.   Return in about 2 weeks (around 05/02/2020) for IN-PERSON, LOB w GBS.  Future Appointments  Date Time Provider Department Center  04/22/2020 11:00 AM [facility] None  05/08/2020 10:55 AM Bernerd Limbo, CNM [facility]  07/24/2020 10:30 AM Shamleffer, Konrad Dolores, MD [facility] None    Bernerd Limbo, CNM

## 2020-04-22 ENCOUNTER — Other Ambulatory Visit (INDEPENDENT_AMBULATORY_CARE_PROVIDER_SITE_OTHER): Payer: BC Managed Care – PPO

## 2020-04-22 ENCOUNTER — Other Ambulatory Visit: Payer: Self-pay

## 2020-04-22 DIAGNOSIS — O99283 Endocrine, nutritional and metabolic diseases complicating pregnancy, third trimester: Secondary | ICD-10-CM | POA: Diagnosis not present

## 2020-04-22 DIAGNOSIS — E039 Hypothyroidism, unspecified: Secondary | ICD-10-CM | POA: Diagnosis not present

## 2020-04-22 LAB — TSH: TSH: 0.6 u[IU]/mL (ref 0.35–4.50)

## 2020-05-08 ENCOUNTER — Ambulatory Visit (INDEPENDENT_AMBULATORY_CARE_PROVIDER_SITE_OTHER): Payer: BC Managed Care – PPO | Admitting: Certified Nurse Midwife

## 2020-05-08 ENCOUNTER — Other Ambulatory Visit: Payer: Self-pay

## 2020-05-08 VITALS — BP 106/78 | HR 122 | Wt 260.7 lb

## 2020-05-08 DIAGNOSIS — Z3A36 36 weeks gestation of pregnancy: Secondary | ICD-10-CM

## 2020-05-08 DIAGNOSIS — Z3403 Encounter for supervision of normal first pregnancy, third trimester: Secondary | ICD-10-CM

## 2020-05-10 NOTE — Progress Notes (Signed)
   PRENATAL VISIT NOTE  Subjective:  Kathryn Fleming is a 30 y.o. G1P0000 at [redacted]w[redacted]d being seen today for ongoing prenatal care.  She is currently monitored for the following issues for this low-risk pregnancy and has Supervision of low-risk pregnancy, unspecified trimester; Hypothyroidism; Postsurgical hypothyroidism; Hypothyroidism affecting pregnancy in second trimester; Abnormal chromosomal and genetic finding on antenatal screening mother; Constipation; COVID-19 affecting pregnancy, antepartum; BMI 45.0-49.9, adult (HCC); and Thrush on their problem list.  Patient reports no complaints.  Contractions: Irritability. Vag. Bleeding: None.  Movement: Present. Denies leaking of fluid.   The following portions of the patient's history were reviewed and updated as appropriate: allergies, current medications, past family history, past medical history, past social history, past surgical history and problem list.   Objective:   Vitals:   05/08/20 1133  BP: 106/78  Pulse: (!) 122  Weight: 260 lb 11.2 oz (118.3 kg)    Fetal Status: Fetal Heart Rate (bpm): 152 Fundal Height: 37 cm Movement: Present  Presentation: Vertex  General:  Alert, oriented and cooperative. Patient is in no acute distress.  Skin: Skin is warm and dry. No rash noted.   Cardiovascular: Normal heart rate noted  Respiratory: Normal respiratory effort, no problems with respiration noted  Abdomen: Soft, gravid, appropriate for gestational age.  Pain/Pressure: Present     Pelvic: Cervical exam deferred        Extremities: Normal range of motion.  Edema: None  Mental Status: Normal mood and affect. Normal behavior. Normal judgment and thought content.   Assessment and Plan:  Pregnancy: G1P0000 at [redacted]w[redacted]d 1. Supervision of low-risk first pregnancy, third trimester - Pt doing well overall, no complaints, feeling plenty of fetal movement - Interested in waterbirth - consented today, will upload certificate  2. [redacted] weeks gestation  of pregnancy - Culture, beta strep (group b only) (declined GC/CT)   Preterm labor symptoms and general obstetric precautions including but not limited to vaginal bleeding, contractions, leaking of fluid and fetal movement were reviewed in detail with the patient. Please refer to After Visit Summary for other counseling recommendations.   Return in about 1 week (around 05/15/2020) for IN-PERSON, LOB.  Future Appointments  Date Time Provider Department Center  05/15/2020 10:35 AM Osborne Oman [facility]  07/24/2020 10:30 AM Shamleffer, Konrad Dolores, MD [facility] None   Edd Arbour, CNM, MSN, [facility] Certified Nurse Midwife, [facility]

## 2020-05-11 ENCOUNTER — Encounter (HOSPITAL_COMMUNITY): Payer: Self-pay | Admitting: Obstetrics & Gynecology

## 2020-05-11 ENCOUNTER — Other Ambulatory Visit: Payer: Self-pay

## 2020-05-11 ENCOUNTER — Inpatient Hospital Stay (HOSPITAL_COMMUNITY)
Admission: AD | Admit: 2020-05-11 | Discharge: 2020-05-12 | Disposition: A | Discharge status: Home / Self Care | Payer: BC Managed Care – PPO | Attending: Obstetrics & Gynecology | Admitting: Obstetrics & Gynecology

## 2020-05-11 DIAGNOSIS — K219 Gastro-esophageal reflux disease without esophagitis: Secondary | ICD-10-CM | POA: Diagnosis not present

## 2020-05-11 DIAGNOSIS — Z79899 Other long term (current) drug therapy: Secondary | ICD-10-CM | POA: Insufficient documentation

## 2020-05-11 DIAGNOSIS — Z3689 Encounter for other specified antenatal screening: Secondary | ICD-10-CM

## 2020-05-11 DIAGNOSIS — Z79890 Hormone replacement therapy (postmenopausal): Secondary | ICD-10-CM | POA: Diagnosis not present

## 2020-05-11 DIAGNOSIS — O99613 Diseases of the digestive system complicating pregnancy, third trimester: Secondary | ICD-10-CM | POA: Insufficient documentation

## 2020-05-11 DIAGNOSIS — O26893 Other specified pregnancy related conditions, third trimester: Secondary | ICD-10-CM | POA: Insufficient documentation

## 2020-05-11 DIAGNOSIS — Z3A37 37 weeks gestation of pregnancy: Secondary | ICD-10-CM | POA: Insufficient documentation

## 2020-05-11 DIAGNOSIS — R079 Chest pain, unspecified: Secondary | ICD-10-CM

## 2020-05-11 DIAGNOSIS — O99891 Other specified diseases and conditions complicating pregnancy: Secondary | ICD-10-CM

## 2020-05-11 DIAGNOSIS — R0789 Other chest pain: Secondary | ICD-10-CM | POA: Diagnosis not present

## 2020-05-11 LAB — CBC WITH DIFFERENTIAL/PLATELET
Abs Immature Granulocytes: 0.06 10*3/uL (ref 0.00–0.07)
Basophils Absolute: 0 10*3/uL (ref 0.0–0.1)
Basophils Relative: 0 %
Eosinophils Absolute: 0 10*3/uL (ref 0.0–0.5)
Eosinophils Relative: 0 %
HCT: 38.4 % (ref 36.0–46.0)
Hemoglobin: 13.3 g/dL (ref 12.0–15.0)
Immature Granulocytes: 1 %
Lymphocytes Relative: 22 %
Lymphs Abs: 2.6 10*3/uL (ref 0.7–4.0)
MCH: 32 pg (ref 26.0–34.0)
MCHC: 34.6 g/dL (ref 30.0–36.0)
MCV: 92.5 fL (ref 80.0–100.0)
Monocytes Absolute: 0.9 10*3/uL (ref 0.1–1.0)
Monocytes Relative: 7 %
Neutro Abs: 8.3 10*3/uL — ABNORMAL HIGH (ref 1.7–7.7)
Neutrophils Relative %: 70 %
Platelets: 305 10*3/uL (ref 150–400)
RBC: 4.15 MIL/uL (ref 3.87–5.11)
RDW: 12.7 % (ref 11.5–15.5)
WBC: 11.9 10*3/uL — ABNORMAL HIGH (ref 4.0–10.5)
nRBC: 0 % (ref 0.0–0.2)

## 2020-05-11 LAB — COMPREHENSIVE METABOLIC PANEL
ALT: 24 U/L (ref 0–44)
AST: 18 U/L (ref 15–41)
Albumin: 2.8 g/dL — ABNORMAL LOW (ref 3.5–5.0)
Alkaline Phosphatase: 145 U/L — ABNORMAL HIGH (ref 38–126)
Anion gap: 11 (ref 5–15)
BUN: 9 mg/dL (ref 6–20)
CO2: 18 mmol/L — ABNORMAL LOW (ref 22–32)
Calcium: 8.8 mg/dL — ABNORMAL LOW (ref 8.9–10.3)
Chloride: 104 mmol/L (ref 98–111)
Creatinine, Ser: 0.49 mg/dL (ref 0.44–1.00)
GFR, Estimated: >60 mL/min (ref >60)
Glucose, Bld: 91 mg/dL (ref 70–99)
Potassium: 3.7 mmol/L (ref 3.5–5.1)
Sodium: 133 mmol/L — ABNORMAL LOW (ref 135–145)
Total Bilirubin: 0.5 mg/dL (ref 0.3–1.2)
Total Protein: 6.3 g/dL — ABNORMAL LOW (ref 6.5–8.1)

## 2020-05-11 LAB — TROPONIN I (HIGH SENSITIVITY): Troponin I (High Sensitivity): 3 ng/L (ref <18)

## 2020-05-11 LAB — LIPASE, BLOOD: Lipase: 27 U/L (ref 11–51)

## 2020-05-11 NOTE — MAU Note (Addendum)
Had chest pain before dinner, went away, later that night @0915  had intense, stabbing pain on right chest and radiating to right shoulder and shoulder blade 8/10. Still complains of pain in same area but not as painful. Reports having reflux and heartburn in pregnancy but reports it does not feel like that. Denies VB and LOF, reports +FM.

## 2020-05-12 DIAGNOSIS — O99891 Other specified diseases and conditions complicating pregnancy: Secondary | ICD-10-CM

## 2020-05-12 DIAGNOSIS — R079 Chest pain, unspecified: Secondary | ICD-10-CM

## 2020-05-12 DIAGNOSIS — Z3A37 37 weeks gestation of pregnancy: Secondary | ICD-10-CM

## 2020-05-12 LAB — CULTURE, BETA STREP (GROUP B ONLY): Strep Gp B Culture: NEGATIVE

## 2020-05-12 NOTE — Discharge Instructions (Signed)

## 2020-05-12 NOTE — MAU Provider Note (Signed)
History     CSN: [id number]  Arrival date and time: 05/11/20 2140   Event Date/Time   First Provider Initiated Contact with Patient 05/12/20 0000      Chief Complaint  Patient presents with  . Chest Pain   HPI Kathryn Fleming is a 30 y.o. G1P0000 at [redacted]w[redacted]d who presents to MAU with chief complaint of left sided anterior chest pain as well as chest wall pain on her left side. These are new problems, onset Friday night before dinner at about 2115. Patient describes the pain as "stabbing", radiating to her shoulder. She experienced an additional episode today and became concerned. She denies personal or family history of acute cardiac events. She denies TTP, weakness, syncope. She denies physically strenuous activity.  Patient endorses history of GERD this pregnancy but states it usually manifests as intense pressure in the middle of her sternum. She states this pain feels very different. She declines medication during evaluation in MAU.  She denies abdominal pain,  vaginal bleeding, leaking of fluid, decreased fetal movement, fever, falls, or recent illness.   Patient receives care with [facility]  OB History    Gravida  1   Para  0   Term  0   Preterm  0   AB  0   Living  0     SAB  0   IAB  0   Ectopic  0   Multiple  0   Live Births  0           Past Medical History:  Diagnosis Date  . Hypothyroidism     Past Surgical History:  Procedure Laterality Date  . BANKART REPAIR  2014  . latarjet repair  2017  . THYROIDECTOMY  2010    Family History  Problem Relation Age of Onset  . Multiple sclerosis Mother   . Hypertension Mother   . Obesity Mother   . Heart disease Father   . Diabetes Father   . Atrial fibrillation Father   . Obesity Father   . Kidney cancer Father     Social History   Tobacco Use  . Smoking status: Never Smoker  . Smokeless tobacco: Never Used  Vaping Use  . Vaping Use: Never used  Substance Use Topics  . Alcohol use: Not  Currently  . Drug use: Never    Allergies:  Allergies  Allergen Reactions  . Levothyroxine Rash    Medications Prior to Admission  Medication Sig Dispense Refill Last Dose  . famotidine (PEPCID) 20 MG tablet Take 1 tablet (20 mg total) by mouth daily. 60 tablet 1 05/11/2020 at Unknown time  . ondansetron (ZOFRAN-ODT) 4 MG disintegrating tablet Take 1 tablet (4 mg total) by mouth every 8 (eight) hours as needed for nausea or vomiting. 90 tablet 2 05/10/2020 at Unknown time  . SYNTHROID 200 MCG tablet Take 1 tablet (200 mcg total) by mouth daily before breakfast. 100 tablet 1 05/11/2020 at Unknown time  . acetaminophen (TYLENOL) 500 MG tablet Take 500 mg by mouth every 6 (six) hours as needed. (Patient not taking: No sig reported)     . Prenatal Vit-Fe Fumarate-FA (PRENATAL MULTIVITAMIN) TABS tablet Take 1 tablet by mouth daily at 12 noon.       Review of Systems  Respiratory: Negative for cough, choking, chest tightness and shortness of breath.   Cardiovascular: Positive for chest pain. Negative for palpitations.  Gastrointestinal: Negative for abdominal pain.  Genitourinary: Negative for vaginal bleeding.  All other systems  reviewed and are negative.  Physical Exam   Blood pressure 119/78, pulse 90, temperature 98 F (36.7 C), resp. rate 18, last menstrual period 08/24/2019.  Physical Exam Vitals and nursing note reviewed.  Constitutional:      Appearance: She is well-developed. She is not ill-appearing.  Cardiovascular:     Rate and Rhythm: Normal rate and regular rhythm.     Heart sounds: Normal heart sounds.  Pulmonary:     Effort: Pulmonary effort is normal.     Breath sounds: Normal breath sounds.  Chest:     Chest wall: No tenderness.  Abdominal:     Palpations: Abdomen is soft.  Skin:    General: Skin is warm and dry.     Capillary Refill: Capillary refill takes less than 2 seconds.  Neurological:     Mental Status: She is alert and oriented to person, place, and  time.  Psychiatric:        Mood and Affect: Mood normal.        Behavior: Behavior normal.    MAU Course  Procedures  --Reactive tracing: baseline 135, mod var, + accels, no decels --Toco: occasional ctx not felt by patient --Medication declined by patient --ECG: Normal Sinus Rhythm  Orders Placed This Encounter  Procedures  . CBC with Differential/Platelet  . Comprehensive metabolic panel  . Lipase, blood  . ED EKG  . Discharge patient   Patient Vitals for the past 24 hrs:  BP Temp Pulse Resp SpO2  05/12/20 0002 108/71 98 F (36.7 C) 76 14 99 %  05/11/20 2217 101/71 -- 91 -- --  05/11/20 2153 119/78 -- 90 -- --  05/11/20 2150 -- 98 F (36.7 C) -- 18 --   Results for orders placed or performed during the hospital encounter of 05/11/20 (from the past 24 hour(s))  Troponin I (High Sensitivity)     Status: None   Collection Time: 05/11/20 10:54 PM  Result Value Ref Range   Troponin I (High Sensitivity) 3 <18 ng/L  CBC with Differential/Platelet     Status: Abnormal   Collection Time: 05/11/20 10:54 PM  Result Value Ref Range   WBC 11.9 (H) 4.0 - 10.5 K/uL   RBC 4.15 3.87 - 5.11 MIL/uL   Hemoglobin 13.3 12.0 - 15.0 g/dL   HCT 93.8 10.1 - 75.1 %   MCV 92.5 80.0 - 100.0 fL   MCH 32.0 26.0 - 34.0 pg   MCHC 34.6 30.0 - 36.0 g/dL   RDW 02.5 85.2 - 77.8 %   Platelets 305 150 - 400 K/uL   nRBC 0.0 0.0 - 0.2 %   Neutrophils Relative % 70 %   Neutro Abs 8.3 (H) 1.7 - 7.7 K/uL   Lymphocytes Relative 22 %   Lymphs Abs 2.6 0.7 - 4.0 K/uL   Monocytes Relative 7 %   Monocytes Absolute 0.9 0.1 - 1.0 K/uL   Eosinophils Relative 0 %   Eosinophils Absolute 0.0 0.0 - 0.5 K/uL   Basophils Relative 0 %   Basophils Absolute 0.0 0.0 - 0.1 K/uL   Immature Granulocytes 1 %   Abs Immature Granulocytes 0.06 0.00 - 0.07 K/uL  Comprehensive metabolic panel     Status: Abnormal   Collection Time: 05/11/20 10:54 PM  Result Value Ref Range   Sodium 133 (L) 135 - 145 mmol/L   Potassium  3.7 3.5 - 5.1 mmol/L   Chloride 104 98 - 111 mmol/L   CO2 18 (L) 22 - 32 mmol/L  Glucose, Bld 91 70 - 99 mg/dL   BUN 9 6 - 20 mg/dL   Creatinine, Ser 6.94 0.44 - 1.00 mg/dL   Calcium 8.8 (L) 8.9 - 10.3 mg/dL   Total Protein 6.3 (L) 6.5 - 8.1 g/dL   Albumin 2.8 (L) 3.5 - 5.0 g/dL   AST 18 15 - 41 U/L   ALT 24 0 - 44 U/L   Alkaline Phosphatase 145 (H) 38 - 126 U/L   Total Bilirubin 0.5 0.3 - 1.2 mg/dL   GFR, Estimated >50 >38 mL/min   Anion gap 11 5 - 15  Lipase, blood     Status: None   Collection Time: 05/11/20 10:54 PM  Result Value Ref Range   Lipase 27 11 - 51 U/L   Assessment and Plan  --30 y.o. G1P0000 at [redacted]w[redacted]d  --Reactive tracing --Normal ECG --No acute findings during evaluation in MAU --Discharge home in stable condition  F/U: --Next appointment at [facility] is 05/15/2020  Calvert Cantor, CNM 05/12/2020, 2:33 AM

## 2020-05-13 ENCOUNTER — Encounter: Payer: Self-pay | Admitting: *Deleted

## 2020-05-15 ENCOUNTER — Ambulatory Visit (INDEPENDENT_AMBULATORY_CARE_PROVIDER_SITE_OTHER): Payer: BC Managed Care – PPO | Admitting: Certified Nurse Midwife

## 2020-05-15 VITALS — BP 105/79 | HR 98 | Wt 264.0 lb

## 2020-05-15 DIAGNOSIS — Z3403 Encounter for supervision of normal first pregnancy, third trimester: Secondary | ICD-10-CM

## 2020-05-15 DIAGNOSIS — O99613 Diseases of the digestive system complicating pregnancy, third trimester: Secondary | ICD-10-CM

## 2020-05-15 DIAGNOSIS — K219 Gastro-esophageal reflux disease without esophagitis: Secondary | ICD-10-CM

## 2020-05-15 DIAGNOSIS — B370 Candidal stomatitis: Secondary | ICD-10-CM

## 2020-05-15 DIAGNOSIS — Z3A37 37 weeks gestation of pregnancy: Secondary | ICD-10-CM

## 2020-05-15 MED ORDER — FLUCONAZOLE 150 MG PO TABS
150.0000 mg | ORAL_TABLET | Freq: Every day | ORAL | 0 refills | Status: DC
Start: 2020-05-15 — End: 2020-05-22

## 2020-05-15 NOTE — Progress Notes (Signed)
   PRENATAL VISIT NOTE  Subjective:  Kathryn Fleming is a 30 y.o. G1P0000 at [redacted]w[redacted]d being seen today for ongoing prenatal care.  She is currently monitored for the following issues for this low-risk pregnancy and has Supervision of low-risk pregnancy, unspecified trimester; Hypothyroidism; Postsurgical hypothyroidism; Hypothyroidism affecting pregnancy in second trimester; Abnormal chromosomal and genetic finding on antenatal screening mother; Constipation; COVID-19 affecting pregnancy, antepartum; BMI 45.0-49.9, adult (HCC); and Thrush on their problem list.  Patient reports heartburn and thinks oral thrush has come back. Had an oral yeast issue earlier in pregnancy after removal of a tooth, was prescribed nystatin but had a hard time adhering to the 4x daily dosing.  Contractions: Irritability. Vag. Bleeding: None.  Movement: Present. Denies leaking of fluid.   The following portions of the patient's history were reviewed and updated as appropriate: allergies, current medications, past family history, past medical history, past social history, past surgical history and problem list.   Objective:   Vitals:   05/15/20 1059  BP: 105/79  Pulse: 98  Weight: 264 lb (119.7 kg)    Fetal Status: Fetal Heart Rate (bpm): 136 Fundal Height: 38 cm Movement: Present     General:  Alert, oriented and cooperative. Patient is in no acute distress.  Skin: Skin is warm and dry. No rash noted.   Cardiovascular: Normal heart rate noted  Respiratory: Normal respiratory effort, no problems with respiration noted  Abdomen: Soft, gravid, appropriate for gestational age.  Pain/Pressure: Present     Pelvic: Cervical exam deferred        Extremities: Normal range of motion.  Edema: None  Mental Status: Normal mood and affect. Normal behavior. Normal judgment and thought content.   Assessment and Plan:  Pregnancy: G1P0000 at [redacted]w[redacted]d 1. Supervision of low-risk first pregnancy, third trimester - Doing well, feeling  plenty of fetal movement - Presented to MAU this week for chest pain which has now resolved - provided reassurance that it was likely musculoskeletal and related to relaxin/fetal positioning  2. [redacted] weeks gestation of pregnancy - Routine OB care - Discussed concerns about who will attend labor. FOB is [place], culturally men do not routinely attend labor/delivery. In addition, he has severe healthcare anxiety and neither of them feel it would be beneficial to have him at the labor. Wants her mother/sister to attend and then FOB will come after delivery for remainder of hospital stay. - Discussed with house coverage and emailed [facility] leadership, will update pt.  3. Oral thrush - fluconazole (DIFLUCAN) 150 MG tablet; Take 1 tablet (150 mg total) by mouth daily.  Dispense: 1 tablet; Refill: 0  4. Gastroesophageal reflux during pregnancy in third trimester, antepartum - GERD is worsening, instructed pt to increase Pepcid dose to 40mg  2x daily, with Tums at night prn  Term labor symptoms and general obstetric precautions including but not limited to vaginal bleeding, contractions, leaking of fluid and fetal movement were reviewed in detail with the patient. Please refer to After Visit Summary for other counseling recommendations.   Return in about 1 week (around 05/22/2020) for IN-PERSON, LOB.  Future Appointments  Date Time Provider Department Center  05/22/2020  9:55 AM 05/24/2020 [facility]  07/24/2020 10:30 AM Shamleffer, 07/26/2020, MD [facility] None    Konrad Dolores, CNM

## 2020-05-22 ENCOUNTER — Ambulatory Visit (INDEPENDENT_AMBULATORY_CARE_PROVIDER_SITE_OTHER): Payer: BC Managed Care – PPO | Admitting: Certified Nurse Midwife

## 2020-05-22 ENCOUNTER — Other Ambulatory Visit: Payer: Self-pay

## 2020-05-22 VITALS — BP 99/75 | HR 98 | Wt 262.0 lb

## 2020-05-22 DIAGNOSIS — B370 Candidal stomatitis: Secondary | ICD-10-CM

## 2020-05-22 DIAGNOSIS — Z3403 Encounter for supervision of normal first pregnancy, third trimester: Secondary | ICD-10-CM

## 2020-05-22 DIAGNOSIS — Z3A38 38 weeks gestation of pregnancy: Secondary | ICD-10-CM

## 2020-05-22 MED ORDER — NYSTATIN 100000 UNIT/ML MT SUSP: 5.0000 mL | Freq: Four times a day (QID) | OROMUCOSAL | 0 refills | Status: DC

## 2020-05-22 NOTE — Progress Notes (Signed)
   PRENATAL VISIT NOTE  Subjective:  Kathryn Fleming is a 30 y.o. G1P0000 at [redacted]w[redacted]d being seen today for ongoing prenatal care.  She is currently monitored for the following issues for this low-risk pregnancy and has Supervision of low-risk pregnancy, unspecified trimester; Hypothyroidism; Postsurgical hypothyroidism; Hypothyroidism affecting pregnancy in second trimester; Abnormal chromosomal and genetic finding on antenatal screening mother; Constipation; COVID-19 affecting pregnancy, antepartum; BMI 45.0-49.9, adult (HCC); and Thrush on their problem list.  Patient reports no complaints other than continued oral thrush, wants to try nystatin again. Plans to be more diligent with dosing this time.  Contractions: Irritability. Vag. Bleeding: None.  Movement: Present. Denies leaking of fluid.   The following portions of the patient's history were reviewed and updated as appropriate: allergies, current medications, past family history, past medical history, past social history, past surgical history and problem list.   Objective:   Vitals:   05/22/20 1003  BP: 99/75  Pulse: 98  Weight: 262 lb (118.8 kg)    Fetal Status:     Movement: Present     General:  Alert, oriented and cooperative. Patient is in no acute distress.  Skin: Skin is warm and dry. No rash noted.   Cardiovascular: Normal heart rate noted  Respiratory: Normal respiratory effort, no problems with respiration noted  Abdomen: Soft, gravid, appropriate for gestational age.  Pain/Pressure: Present     Pelvic: Cervical exam deferred        Extremities: Normal range of motion.  Edema: None  Mental Status: Normal mood and affect. Normal behavior. Normal judgment and thought content.   Assessment and Plan:  Pregnancy: G1P0000 at [redacted]w[redacted]d 1. Supervision of low-risk first pregnancy, third trimester - Doing well, feeling vigorous fetal movement  2. [redacted] weeks gestation of pregnancy - Routine OB care - Able to get clearance for  mom/sister to be labor support, one will leave so FOB can come in after delivery and stay for remainder of delivery admission. L&D/MB leadership and security aware. - Suggested use of evening primrose oil for cervical softening  3. Oral thrush - Reviewed importance of consistent use, advised to rub solution on tongue and then swish/swallow. Also suggested rinsing with vinegar solution before bed and upon rising. - Advised adding probiotic to daily supplement regimen - nystatin (MYCOSTATIN) 100000 UNIT/ML suspension; Take 5 mLs (500,000 Units total) by mouth 4 (four) times daily.  Dispense: 60 mL; Refill: 0  Term labor symptoms and general obstetric precautions including but not limited to vaginal bleeding, contractions, leaking of fluid and fetal movement were reviewed in detail with the patient. Please refer to After Visit Summary for other counseling recommendations.   Return in about 1 week (around 05/29/2020) for IN-PERSON, LOB.  Future Appointments  Date Time Provider Department Center  05/29/2020  9:40 AM Osborne Oman [facility]  07/24/2020 10:30 AM Shamleffer, Konrad Dolores, MD [facility] None    Bernerd Limbo, CNM]

## 2020-05-22 NOTE — Patient Instructions (Signed)
Rosen's Emergency Medicine: Concepts and Clinical Practice (9th ed., pp. 2296- 2312). Elsevier.">  Braxton Hicks Contractions Contractions of the uterus can occur throughout pregnancy, but they are not always a sign that you are in labor. You may have practice contractions called Braxton Hicks contractions. These false labor contractions are sometimes confused with true labor. What are Braxton Hicks contractions? Braxton Hicks contractions are tightening movements that occur in the muscles of the uterus before labor. Unlike true labor contractions, these contractions do not result in opening (dilation) and thinning of the cervix. Toward the end of pregnancy (32-34 weeks), Braxton Hicks contractions can happen more often and may become stronger. These contractions are sometimes difficult to tell apart from true labor because they can be very uncomfortable. You should not feel embarrassed if you go to the hospital with false labor. Sometimes, the only way to tell if you are in true labor is for your health care provider to look for changes in the cervix. The health care provider will do a physical exam and may monitor your contractions. If you are not in true labor, the exam should show that your cervix is not dilating and your water has not broken. If there are no other health problems associated with your pregnancy, it is completely safe for you to be sent home with false labor. You may continue to have Braxton Hicks contractions until you go into true labor. How to tell the difference between true labor and false labor True labor  Contractions last 30-70 seconds.  Contractions become very regular.  Discomfort is usually felt in the top of the uterus, and it spreads to the lower abdomen and low back.  Contractions do not go away with walking.  Contractions usually become more intense and increase in frequency.  The cervix dilates and gets thinner. False labor  Contractions are usually shorter  and not as strong as true labor contractions.  Contractions are usually irregular.  Contractions are often felt in the front of the lower abdomen and in the groin.  Contractions may go away when you walk around or change positions while lying down.  Contractions get weaker and are shorter-lasting as time goes on.  The cervix usually does not dilate or become thin. Follow these instructions at home:  Take over-the-counter and prescription medicines only as told by your health care provider.  Keep up with your usual exercises and follow other instructions from your health care provider.  Eat and drink lightly if you think you are going into labor.  If Braxton Hicks contractions are making you uncomfortable: ? Change your position from lying down or resting to walking, or change from walking to resting. ? Sit and rest in a tub of warm water. ? Drink enough fluid to keep your urine pale yellow. Dehydration may cause these contractions. ? Do slow and deep breathing several times an hour.  Keep all follow-up prenatal visits as told by your health care provider. This is important.   Contact a health care provider if:  You have a fever.  You have continuous pain in your abdomen. Get help right away if:  Your contractions become stronger, more regular, and closer together.  You have fluid leaking or gushing from your vagina.  You pass blood-tinged mucus (bloody show).  You have bleeding from your vagina.  You have low back pain that you never had before.  You feel your baby's head pushing down and causing pelvic pressure.  Your baby is not moving inside   you as much as it used to. Summary  Contractions that occur before labor are called Braxton Hicks contractions, false labor, or practice contractions.  Braxton Hicks contractions are usually shorter, weaker, farther apart, and less regular than true labor contractions. True labor contractions usually become progressively  stronger and regular, and they become more frequent.  Manage discomfort from Braxton Hicks contractions by changing position, resting in a warm bath, drinking plenty of water, or practicing deep breathing. This information is not intended to replace advice given to you by your health care provider. Make sure you discuss any questions you have with your health care provider. Document Revised: 01/01/2017 Document Reviewed: 06/04/2016 Elsevier Patient Education  2021 Elsevier Inc.    First Stage of Labor Labor is your body's natural process of moving your baby and other structures, including the placenta and umbilical cord, out of your uterus. There are three stages of labor. How long each stage lasts is different for every woman. But certain events happen during each stage that are the same for everyone.  The first stage starts when true labor begins. This stage ends when your cervix, which is the opening from your uterus into your vagina, is completely open (dilated).  The second stage begins when your cervix is fully dilated and you start pushing. This stage ends when your baby is born.  The third stage is the delivery of the organ that nourished your baby during pregnancy (placenta). First stage of labor As your due date gets closer, you may start to notice certain physical changes that mean labor is going to start soon. You may feel that your baby has dropped lower into your pelvis. You may experience irregular, often painless, contractions that go away when you walk around or lie down (Braxton Hicks contractions). This is also called false labor. The first stage of labor begins when you start having contractions that come at regular (evenly spaced) intervals and your cervix starts to get thinner and wider in preparation for your baby to pass through. Birth care providers measure the dilation of your cervix in centimeters (cm). One centimeter is a little less than one-half of an inch. The first  stage ends when your cervix is dilated to 10 cm. The first stage of labor is divided into three phases:  Early phase.  Active phase.  Transitional phase. The length of the first stage of labor varies. It may be longer if this is your first pregnancy. You may spend most of this stage at home trying to relax and stay comfortable. How does this affect me? During the first stage of labor, you will move through three phases. What happens in the early phase?  You will start to have regular contractions that last 30-60 seconds. Contractions may come every 5-20 minutes. Keep track of your contractions and call your birth care provider.  Your water may break during this phase.  You may notice a clear or slightly bloody discharge of mucus (mucus plug) from your vagina.  Your cervix will dilate to 3-6 cm. What happens in the active phase? The active phase usually lasts 3-5 hours. You may go to the hospital or birth center around this time. During the active phase:  Your contractions will become stronger, longer, and more uncomfortable.  Your contractions may last 45-90 seconds and come every 3-5 minutes.  You may feel lower back pain.  Your birth care providers may examine your cervix and feel your belly to find the position of your baby.    You may have a monitor strapped to your belly to measure your contractions and your baby's heart rate.  You may start using your pain management options.  Your cervix may be dilated to 6 cm and may start to dilate more quickly. What happens in the transitional phase? The transitional phase typically lasts from 30 minutes to 2 hours. At the end of this phase, your cervix will be fully dilated to 10 cm. During the transitional phase:  Contractions will get stronger and longer.  Contractions may last 60-90 seconds and come less than 2 minutes apart.  You may feel hot flashes, chills, or nausea. How does this affect my baby? During the first stage of  labor, your baby will gradually move down into your birth canal. Follow these instructions at home and in the hospital or birth center:  When labor first begins, try to stay calm. You are still in the early phase. If it is night, try to get some sleep. If it is day, try to relax and save your energy. You may want to make some calls and get ready to go to the hospital or birth center.  When you are in the early phase, try these methods to help ease discomfort: ? Deep breathing and muscle relaxation. ? Taking a walk. ? Taking a warm bath or shower.  Drink some fluids and have a light snack if you feel like it.  Keep track of your contractions.  Based on the plan you created with your birth care provider, call when your contractions indicate it is time.  If your water breaks, note the time, color, and odor of the fluid.  When you are in the active phase, do your breathing exercises and rely on your support people and your team of birth care providers.   Contact a health care provider if:  Your contractions are strong and regular.  You have lower back pain or cramping.  Your water breaks.  You lose your mucus plug. Get help right away if you:  Have a severe headache that does not go away.  Have changes in your vision.  Have severe pain in your upper belly.  Do not feel the baby move.  Have bright red bleeding. Summary  The first stage of labor starts when true labor begins, and it ends when your cervix is dilated to 10 cm.  The first stage of labor has three phases: early, active, and transitional.  Your baby moves into the birth canal during the first stage of labor.  You may have contractions that become stronger and longer. You may also lose your mucus plug and have your water break.  Call your birth care provider when your contractions are frequent and strong enough to go to the hospital or birth center. This information is not intended to replace advice given to you  by your health care provider. Make sure you discuss any questions you have with your health care provider. Document Revised: 05/12/2018 Document Reviewed: 04/04/2017 Elsevier Patient Education  2021 [employer].

## 2020-05-29 ENCOUNTER — Ambulatory Visit (INDEPENDENT_AMBULATORY_CARE_PROVIDER_SITE_OTHER): Payer: BC Managed Care – PPO | Admitting: Certified Nurse Midwife

## 2020-05-29 ENCOUNTER — Other Ambulatory Visit: Payer: Self-pay

## 2020-05-29 VITALS — BP 111/78 | HR 105 | Wt 262.3 lb

## 2020-05-29 DIAGNOSIS — Z3A39 39 weeks gestation of pregnancy: Secondary | ICD-10-CM

## 2020-05-29 DIAGNOSIS — Z3403 Encounter for supervision of normal first pregnancy, third trimester: Secondary | ICD-10-CM

## 2020-05-29 NOTE — Progress Notes (Signed)
Patient states that she is having pain/pressure in uterus. Patient also complains of vaginal mucus that is pink, along with "little clots" that's started Friday afternoon. Last night around 11 pm patient started having contractions that were 6-8 minutes apart that would last about 1 minute. Vaginal mucus has increased. Patient is having pain that she rates about a 2. Patient declines any watery leakage.

## 2020-05-30 ENCOUNTER — Inpatient Hospital Stay (HOSPITAL_COMMUNITY)
Admission: AD | Admit: 2020-05-30 | Discharge: 2020-06-01 | DRG: 807 | Disposition: A | Discharge status: Home / Self Care | Payer: BC Managed Care – PPO | Attending: Family Medicine | Admitting: Family Medicine

## 2020-05-30 ENCOUNTER — Encounter (HOSPITAL_COMMUNITY): Payer: Self-pay | Admitting: Family Medicine

## 2020-05-30 ENCOUNTER — Inpatient Hospital Stay (HOSPITAL_COMMUNITY): Admit: 2020-05-30 | Payer: Self-pay

## 2020-05-30 ENCOUNTER — Other Ambulatory Visit: Payer: Self-pay

## 2020-05-30 DIAGNOSIS — O285 Abnormal chromosomal and genetic finding on antenatal screening of mother: Secondary | ICD-10-CM | POA: Diagnosis present

## 2020-05-30 DIAGNOSIS — Z8616 Personal history of COVID-19: Secondary | ICD-10-CM | POA: Diagnosis not present

## 2020-05-30 DIAGNOSIS — Z6841 Body Mass Index (BMI) 40.0 and over, adult: Secondary | ICD-10-CM

## 2020-05-30 DIAGNOSIS — O99284 Endocrine, nutritional and metabolic diseases complicating childbirth: Secondary | ICD-10-CM | POA: Diagnosis present

## 2020-05-30 DIAGNOSIS — E890 Postprocedural hypothyroidism: Secondary | ICD-10-CM | POA: Diagnosis present

## 2020-05-30 DIAGNOSIS — O4202 Full-term premature rupture of membranes, onset of labor within 24 hours of rupture: Secondary | ICD-10-CM | POA: Diagnosis not present

## 2020-05-30 DIAGNOSIS — E039 Hypothyroidism, unspecified: Secondary | ICD-10-CM | POA: Diagnosis present

## 2020-05-30 DIAGNOSIS — O98519 Other viral diseases complicating pregnancy, unspecified trimester: Secondary | ICD-10-CM | POA: Diagnosis present

## 2020-05-30 DIAGNOSIS — Z3A40 40 weeks gestation of pregnancy: Secondary | ICD-10-CM

## 2020-05-30 DIAGNOSIS — U071 COVID-19: Secondary | ICD-10-CM | POA: Diagnosis present

## 2020-05-30 DIAGNOSIS — Z20822 Contact with and (suspected) exposure to covid-19: Secondary | ICD-10-CM | POA: Diagnosis present

## 2020-05-30 LAB — RESP PANEL BY RT-PCR (FLU A&B, COVID) ARPGX2
Influenza A by PCR: NEGATIVE
Influenza B by PCR: NEGATIVE
SARS Coronavirus 2 by RT PCR: NEGATIVE

## 2020-05-30 MED ORDER — ACETAMINOPHEN 325 MG PO TABS: 650.0000 mg | ORAL_TABLET | ORAL | Status: DC | PRN

## 2020-05-30 MED ORDER — ONDANSETRON 4 MG PO TBDP
4.0000 mg | ORAL_TABLET | Freq: Three times a day (TID) | ORAL | Status: DC | PRN
  Administered 2020-05-30: 4 mg via ORAL
  Filled 2020-05-30: qty 1

## 2020-05-30 MED ORDER — FENTANYL CITRATE (PF) 100 MCG/2ML IJ SOLN
100.0000 ug | INTRAMUSCULAR | Status: DC | PRN
  Administered 2020-05-30 (×2): 100 ug via INTRAVENOUS
  Filled 2020-05-30 (×2): qty 2

## 2020-05-30 MED ORDER — LIDOCAINE HCL (PF) 1 % IJ SOLN
30.0000 mL | INTRAMUSCULAR | Status: AC | PRN
  Administered 2020-05-31: 30 mL via SUBCUTANEOUS
  Filled 2020-05-30: qty 30

## 2020-05-30 MED ORDER — OXYCODONE-ACETAMINOPHEN 5-325 MG PO TABS: 2.0000 | ORAL_TABLET | ORAL | Status: DC | PRN

## 2020-05-30 MED ORDER — OXYTOCIN-SODIUM CHLORIDE 30-0.9 UT/500ML-% IV SOLN
2.5000 [IU]/h | INTRAVENOUS | Status: DC
  Administered 2020-05-31: 2.5 [IU]/h via INTRAVENOUS
  Filled 2020-05-30: qty 500

## 2020-05-30 MED ORDER — ONDANSETRON HCL 4 MG/2ML IJ SOLN
4.0000 mg | Freq: Four times a day (QID) | INTRAMUSCULAR | Status: DC | PRN
  Administered 2020-05-31: 4 mg via INTRAVENOUS
  Filled 2020-05-30: qty 2

## 2020-05-30 MED ORDER — SOD CITRATE-CITRIC ACID 500-334 MG/5ML PO SOLN
30.0000 mL | ORAL | Status: DC | PRN
Start: 2020-05-30 — End: 2020-05-31

## 2020-05-30 MED ORDER — OXYTOCIN BOLUS FROM INFUSION
333.0000 mL | Freq: Once | INTRAVENOUS | Status: AC
  Administered 2020-05-31: 333 mL via INTRAVENOUS

## 2020-05-30 MED ORDER — LACTATED RINGERS IV SOLN: 500.0000 mL | INTRAVENOUS | Status: DC | PRN

## 2020-05-30 MED ORDER — FENTANYL CITRATE (PF) 100 MCG/2ML IJ SOLN
100.0000 ug | INTRAMUSCULAR | Status: DC | PRN
  Administered 2020-05-31 (×2): 100 ug via INTRAVENOUS
  Filled 2020-05-30 (×2): qty 2

## 2020-05-30 MED ORDER — OXYCODONE-ACETAMINOPHEN 5-325 MG PO TABS: 1.0000 | ORAL_TABLET | ORAL | Status: DC | PRN

## 2020-05-30 MED ORDER — LACTATED RINGERS IV SOLN: INTRAVENOUS | Status: DC

## 2020-05-30 NOTE — H&P (Signed)
Kathryn Fleming is a 30 y.o. female G1P0000 with IUP at [redacted]w[redacted]d presenting for IOL for elective d/t prolonged latent phase labor. [facility].  She has been having ctx for over 24 hours, views them as very painful, yet cx is still 1cm.  Discussed eIOL w/Jamilla Walker, who offered IOL w/foley/cytotec and IV narcotics to hopefully facilitate a therapeutic rest.   Prenatal History/Complications:  1st baby   Past Medical History: Past Medical History:  Diagnosis Date  . Hypothyroidism     Past Surgical History: Past Surgical History:  Procedure Laterality Date  . BANKART REPAIR  2014  . latarjet repair  2017  . THYROIDECTOMY  2010    Obstetrical History: OB History    Gravida  1   Para  0   Term  0   Preterm  0   AB  0   Living  0     SAB  0   IAB  0   Ectopic  0   Multiple  0   Live Births  0           Social History: Social History   Socioeconomic History  . Marital status: Married    Spouse name: Not on file  . Number of children: Not on file  . Years of education: Not on file  . Highest education level: Not on file  Occupational History  . Not on file  Tobacco Use  . Smoking status: Never Smoker  . Smokeless tobacco: Never Used  Vaping Use  . Vaping Use: Never used  Substance and Sexual Activity  . Alcohol use: Not Currently  . Drug use: Never  . Sexual activity: Yes    Birth control/protection: None  Other Topics Concern  . Not on file  Social History Narrative  . Not on file   Social Determinants of Health   Financial Resource Strain: Not on file  Food Insecurity: No Food Insecurity  . Worried About Programme researcher, broadcasting/film/video in the Last Year: Never true  . Ran Out of Food in the Last Year: Never true  Transportation Needs: No Transportation Needs  . Lack of Transportation (Medical): No  . Lack of Transportation (Non-Medical): No  Physical Activity: Not on file  Stress: Not on file  Social Connections: Not on file    Family  History: Family History  Problem Relation Age of Onset  . Multiple sclerosis Mother   . Hypertension Mother   . Obesity Mother   . Heart disease Father   . Diabetes Father   . Atrial fibrillation Father   . Obesity Father   . Kidney cancer Father     Allergies: Allergies  Allergen Reactions  . Levothyroxine Rash    Medications Prior to Admission  Medication Sig Dispense Refill Last Dose  . famotidine (PEPCID) 20 MG tablet Take 1 tablet (20 mg total) by mouth daily. 60 tablet 1 05/30/2020 at Unknown time  . ondansetron (ZOFRAN-ODT) 4 MG disintegrating tablet Take 1 tablet (4 mg total) by mouth every 8 (eight) hours as needed for nausea or vomiting. 90 tablet 2 05/30/2020 at Unknown time  . Prenatal Vit-Fe Fumarate-FA (PRENATAL MULTIVITAMIN) TABS tablet Take 1 tablet by mouth daily at 12 noon.   Past Week at Unknown time  . acetaminophen (TYLENOL) 500 MG tablet Take 500 mg by mouth every 6 (six) hours as needed. (Patient not taking: Reported on 05/29/2020)     . nystatin (MYCOSTATIN) 100000 UNIT/ML suspension Take 5 mLs (500,000 Units  total) by mouth 4 (four) times daily. (Patient not taking: Reported on 05/29/2020) 60 mL 0   . SYNTHROID 200 MCG tablet Take 1 tablet (200 mcg total) by mouth daily before breakfast. 100 tablet 1         Review of Systems   Constitutional: Negative for fever and chills Eyes: Negative for visual disturbances Respiratory: Negative for shortness of breath, dyspnea Cardiovascular: Negative for chest pain or palpitations  Gastrointestinal: Negative for diarrhea and constipation.   Genitourinary: Negative for dysuria and urgency Musculoskeletal: Negative for back pain, joint pain, myalgias  Neurological: Negative for dizziness and headaches      Blood pressure 137/90, pulse 91, temperature 98.6 F (37 C), temperature source Oral, resp. rate (!) 22, height 5\' 6"  (1.676 m), weight 117.7 kg, last menstrual period 08/24/2019. General appearance: alert,  cooperative and mild distress Lungs: normal respiratory effort Heart: regular rate and rhythm Abdomen: soft, non-tender; bowel sounds normal Extremities: Homans sign is negative, no sign of DVT DTR's 2+ Presentation: cephalic Fetal monitoring  Baseline: 140 bpm, Variability: Good {> 6 bpm), Accelerations: Reactive and Decelerations: Absent Uterine activity   2-4 minutes  Dilation: 1 Effacement (%): 70 Station: -2 Exam by:: DCALLAWAY, RN   Prenatal labs: ABO, Rh: A/Positive/-- (09/17 1126) Antibody: Negative (09/17 1126) Rubella: immune RPR: Non Reactive (02/07 0958)  HBsAg: Negative (09/17 1126)  HIV: Non Reactive (02/07 0958)  GBS: Negative/-- (04/06 1515)   Nursing Staff Provider  Office Location [facility] Dating  08-11-1977  Language  English Anatomy US  normal, nonvisualized aortic arch but MFM not worried (see note)  Flu Vaccine  10/20/19 Genetic Screen  NIPS: low risk female  AFP:   declined   TDaP Vaccine  03/11/20  Hgb A1C or  GTT Early normal Third trimester   COVID Vaccine March & April 2021   LAB RESULTS   Rhogam  NA Blood Type A/Positive/-- (09/17 1126)   Feeding Plan Breast  Antibody Negative (09/17 1126)  Contraception NFP/condoms Rubella 1.65 (09/17 1126)  Circumcision NA RPR Non Reactive (09/17 1126)   Pediatrician  [facility] 03-20-1983, MD) HBsAg Negative (09/17 1126)   Support Person Husband  HCVAb Negative  Prenatal Classes  HIV Non Reactive (09/17 1126)     BTL Consent NA GBS   (For PCN allergy, check sensitivities)   VBAC Consent NA Pap 09/27/19 - normal +HRHPV    Hgb Electro    BP Cuff Instructed to buy CF     SMA     Waterbirth  [x ] Class [x ] Consent [x ] CNM visit    Induction  [ ]  Orders Entered [ ] Foley Y/N     Prenatal Transfer Tool  Maternal Diabetes: No Genetic Screening: Normal Maternal Ultrasounds/Referrals: Normal Fetal Ultrasounds or other Referrals:  None Maternal Substance Abuse:  No Significant Maternal Medications:   None Significant Maternal Lab Results: Group B Strep negative    No results found for this or any previous visit (from the past 24 hour(s)).  Assessment: Kathryn Fleming is a 31 y.o. G1P0000 with an IUP at [redacted]w[redacted]d presenting for IOL for prolonged latent phase..  Plan: #Labor: Cytotec->Foley->pitocin #Pain:  Per request #FWB Cat 1   Clarita Leber 05/30/2020, 10:43 PM

## 2020-05-30 NOTE — Progress Notes (Signed)
   PRENATAL VISIT NOTE  Subjective:  Kathryn Fleming is a 30 y.o. G1P0000 at [redacted]w[redacted]d being seen today for ongoing prenatal care.  She is currently monitored for the following issues for this low-risk pregnancy and has Supervision of low-risk pregnancy, unspecified trimester; Hypothyroidism; Postsurgical hypothyroidism; Hypothyroidism affecting pregnancy in second trimester; Abnormal chromosomal and genetic finding on antenatal screening mother; Constipation; COVID-19 affecting pregnancy, antepartum; BMI 45.0-49.9, adult (HCC); and Thrush on their problem list.  Patient reports strong contractions overnight with occasional bloody show that slowed down during the day.  Contractions: Irritability. Vag. Bleeding: Scant.  Movement: Present. Denies leaking of fluid.   The following portions of the patient's history were reviewed and updated as appropriate: allergies, current medications, past family history, past medical history, past social history, past surgical history and problem list.   Objective:   Vitals:   05/29/20 0951  BP: 111/78  Pulse: (!) 105  Weight: 262 lb 4.8 oz (119 kg)    Fetal Status: Fetal Heart Rate (bpm): 138 Fundal Height: 41 cm Movement: Present  Presentation: Vertex  General:  Alert, oriented and cooperative. Patient is in no acute distress.  Skin: Skin is warm and dry. No rash noted.   Cardiovascular: Normal heart rate noted  Respiratory: Normal respiratory effort, no problems with respiration noted  Abdomen: Soft, gravid, appropriate for gestational age.  Pain/Pressure: Present     Pelvic: Cervical exam performed in the presence of a chaperone Dilation: 1 Effacement (%): 50 Station: Ballotable  Extremities: Normal range of motion.  Edema: None  Mental Status: Normal mood and affect. Normal behavior. Normal judgment and thought content.   Assessment and Plan:  Pregnancy: G1P0000 at [redacted]w[redacted]d 1. [redacted] weeks gestation of pregnancy - Routine OB care - Discussed post-dates  IOL in one week if no SOL, reviewed likely methods if cervix same or greater ripening as today, pt amenable to plan - IOL scheduled for AM of 06/05/20, pt aware to wait for hospital to call - IOL orders signed/held  2. Encounter for supervision of low-risk first pregnancy in third trimester - Doing well, still feeling plenty of fetal movement, feeling eager to have labor started  Term labor symptoms and general obstetric precautions including but not limited to vaginal bleeding, contractions, leaking of fluid and fetal movement were reviewed in detail with the patient. Please refer to After Visit Summary for other counseling recommendations.   Future Appointments  Date Time Provider Department Center  06/03/2020  8:15 AM [facility]  07/03/2020 10:55 AM Osborne Oman [facility]  07/24/2020 10:30 AM Shamleffer, Konrad Dolores, MD [facility] None    Bernerd Limbo, CNM

## 2020-05-30 NOTE — MAU Note (Addendum)
PT SAYS UC ARE STRONG - SINCE 7PM LAST NIGHT .  [facility] WITH CLINIC. DENIES HSV AND MRSA.  GBS- NEG 1 CM YESTERDAY

## 2020-05-31 ENCOUNTER — Inpatient Hospital Stay (HOSPITAL_COMMUNITY): Payer: BC Managed Care – PPO | Admitting: Anesthesiology

## 2020-05-31 ENCOUNTER — Encounter (HOSPITAL_COMMUNITY): Payer: Self-pay | Admitting: Family Medicine

## 2020-05-31 DIAGNOSIS — Z3A40 40 weeks gestation of pregnancy: Secondary | ICD-10-CM

## 2020-05-31 DIAGNOSIS — O4202 Full-term premature rupture of membranes, onset of labor within 24 hours of rupture: Secondary | ICD-10-CM

## 2020-05-31 LAB — CBC
HCT: 40.3 % (ref 36.0–46.0)
Hemoglobin: 13.7 g/dL (ref 12.0–15.0)
MCH: 30.9 pg (ref 26.0–34.0)
MCHC: 34 g/dL (ref 30.0–36.0)
MCV: 91 fL (ref 80.0–100.0)
Platelets: 331 10*3/uL (ref 150–400)
RBC: 4.43 MIL/uL (ref 3.87–5.11)
RDW: 12.8 % (ref 11.5–15.5)
WBC: 12.2 10*3/uL — ABNORMAL HIGH (ref 4.0–10.5)
nRBC: 0 % (ref 0.0–0.2)

## 2020-05-31 LAB — TYPE AND SCREEN
ABO/RH(D): A POS
Antibody Screen: NEGATIVE

## 2020-05-31 LAB — RPR: RPR Ser Ql: NONREACTIVE

## 2020-05-31 MED ORDER — METHYLERGONOVINE MALEATE 0.2 MG PO TABS: 0.2000 mg | ORAL_TABLET | ORAL | Status: DC | PRN

## 2020-05-31 MED ORDER — LIDOCAINE HCL (PF) 1 % IJ SOLN
INTRAMUSCULAR | Status: DC | PRN
  Administered 2020-05-31: 5 mL via EPIDURAL

## 2020-05-31 MED ORDER — PHENYLEPHRINE 40 MCG/ML (10ML) SYRINGE FOR IV PUSH (FOR BLOOD PRESSURE SUPPORT): 80.0000 ug | PREFILLED_SYRINGE | INTRAVENOUS | Status: DC | PRN

## 2020-05-31 MED ORDER — ONDANSETRON HCL 4 MG PO TABS
4.0000 mg | ORAL_TABLET | ORAL | Status: DC | PRN
Start: 2020-05-31 — End: 2020-06-01

## 2020-05-31 MED ORDER — DIBUCAINE (PERIANAL) 1 % EX OINT: 1.0000 "application " | TOPICAL_OINTMENT | CUTANEOUS | Status: DC | PRN

## 2020-05-31 MED ORDER — BISACODYL 10 MG RE SUPP
10.0000 mg | Freq: Every day | RECTAL | Status: DC | PRN
Start: 2020-05-31 — End: 2020-06-01

## 2020-05-31 MED ORDER — FENTANYL-BUPIVACAINE-NACL 0.5-0.125-0.9 MG/250ML-% EP SOLN
12.0000 mL/h | EPIDURAL | Status: DC | PRN
  Filled 2020-05-31: qty 250

## 2020-05-31 MED ORDER — METHYLERGONOVINE MALEATE 0.2 MG/ML IJ SOLN: 0.2000 mg | INTRAMUSCULAR | Status: DC | PRN

## 2020-05-31 MED ORDER — EPHEDRINE 5 MG/ML INJ: 10.0000 mg | INTRAVENOUS | Status: DC | PRN

## 2020-05-31 MED ORDER — FERROUS SULFATE 325 (65 FE) MG PO TABS
325.0000 mg | ORAL_TABLET | ORAL | Status: DC
  Administered 2020-05-31: 325 mg via ORAL
  Filled 2020-05-31: qty 1

## 2020-05-31 MED ORDER — WITCH HAZEL-GLYCERIN EX PADS
1.0000 "application " | MEDICATED_PAD | CUTANEOUS | Status: DC | PRN
  Administered 2020-05-31: 1 via TOPICAL

## 2020-05-31 MED ORDER — FLEET ENEMA 7-19 GM/118ML RE ENEM: 1.0000 | ENEMA | Freq: Every day | RECTAL | Status: DC | PRN

## 2020-05-31 MED ORDER — TETANUS-DIPHTH-ACELL PERTUSSIS 5-2.5-18.5 LF-MCG/0.5 IM SUSY: 0.5000 mL | PREFILLED_SYRINGE | Freq: Once | INTRAMUSCULAR | Status: DC

## 2020-05-31 MED ORDER — DIPHENHYDRAMINE HCL 25 MG PO CAPS: 25.0000 mg | ORAL_CAPSULE | Freq: Four times a day (QID) | ORAL | Status: DC | PRN

## 2020-05-31 MED ORDER — LACTATED RINGERS IV SOLN: 500.0000 mL | Freq: Once | INTRAVENOUS | Status: DC

## 2020-05-31 MED ORDER — MEDROXYPROGESTERONE ACETATE 150 MG/ML IM SUSP: 150.0000 mg | INTRAMUSCULAR | Status: DC | PRN

## 2020-05-31 MED ORDER — ACETAMINOPHEN 325 MG PO TABS
650.0000 mg | ORAL_TABLET | ORAL | Status: DC | PRN
  Filled 2020-05-31: qty 2

## 2020-05-31 MED ORDER — COCONUT OIL OIL: 1.0000 "application " | TOPICAL_OIL | Status: DC | PRN

## 2020-05-31 MED ORDER — FENTANYL-BUPIVACAINE-NACL 0.5-0.125-0.9 MG/250ML-% EP SOLN
EPIDURAL | Status: DC | PRN
  Administered 2020-05-31: 12 mL/h via EPIDURAL

## 2020-05-31 MED ORDER — DOCUSATE SODIUM 100 MG PO CAPS
100.0000 mg | ORAL_CAPSULE | Freq: Two times a day (BID) | ORAL | Status: DC
  Administered 2020-06-01 (×2): 100 mg via ORAL
  Filled 2020-05-31 (×3): qty 1

## 2020-05-31 MED ORDER — MEASLES, MUMPS & RUBELLA VAC IJ SOLR: 0.5000 mL | Freq: Once | INTRAMUSCULAR | Status: DC

## 2020-05-31 MED ORDER — ONDANSETRON HCL 4 MG/2ML IJ SOLN
4.0000 mg | INTRAMUSCULAR | Status: DC | PRN
Start: 2020-05-31 — End: 2020-06-01

## 2020-05-31 MED ORDER — PRENATAL MULTIVITAMIN CH
1.0000 | ORAL_TABLET | Freq: Every day | ORAL | Status: DC
  Administered 2020-05-31 – 2020-06-01 (×2): 1 via ORAL
  Filled 2020-05-31 (×2): qty 1

## 2020-05-31 MED ORDER — SIMETHICONE 80 MG PO CHEW: 80.0000 mg | CHEWABLE_TABLET | ORAL | Status: DC | PRN

## 2020-05-31 MED ORDER — IBUPROFEN 600 MG PO TABS
600.0000 mg | ORAL_TABLET | Freq: Four times a day (QID) | ORAL | Status: DC
  Administered 2020-05-31 – 2020-06-01 (×6): 600 mg via ORAL
  Filled 2020-05-31 (×6): qty 1

## 2020-05-31 MED ORDER — DIPHENHYDRAMINE HCL 50 MG/ML IJ SOLN
12.5000 mg | INTRAMUSCULAR | Status: DC | PRN
Start: 2020-05-31 — End: 2020-05-31

## 2020-05-31 MED ORDER — BENZOCAINE-MENTHOL 20-0.5 % EX AERO
1.0000 "application " | INHALATION_SPRAY | CUTANEOUS | Status: DC | PRN
  Administered 2020-05-31: 1 via TOPICAL
  Filled 2020-05-31: qty 56

## 2020-05-31 NOTE — Anesthesia Preprocedure Evaluation (Addendum)
Anesthesia Evaluation  Patient identified by MRN, date of birth, ID band Patient awake    Reviewed: Allergy & Precautions, NPO status , Patient's Chart, lab work & pertinent test results  Airway Mallampati: II  TM Distance: >3 FB Neck ROM: Full    Dental no notable dental hx. (+) Teeth Intact, Dental Advisory Given   Pulmonary neg pulmonary ROS,    Pulmonary exam normal breath sounds clear to auscultation       Cardiovascular Exercise Tolerance: Good Normal cardiovascular exam Rhythm:Regular Rate:Normal     Neuro/Psych negative neurological ROS  negative psych ROS   GI/Hepatic negative GI ROS, Neg liver ROS,   Endo/Other  Hypothyroidism   Renal/GU negative Renal ROS     Musculoskeletal   Abdominal (+) + obese,   Peds  Hematology Lab Results      Component                Value               Date                      WBC                      12.2 (H)            05/31/2020                HGB                      13.7                05/31/2020                HCT                      40.3                05/31/2020                MCV                      91.0                05/31/2020                PLT                      331                 05/31/2020              Anesthesia Other Findings   Reproductive/Obstetrics (+) Pregnancy                            Anesthesia Physical Anesthesia Plan  ASA: III  Anesthesia Plan: Epidural   Post-op Pain Management:    Induction:   PONV Risk Score and Plan:   Airway Management Planned:   Additional Equipment:   Intra-op Plan:   Post-operative Plan:   Informed Consent: I have reviewed the patients History and Physical, chart, labs and discussed the procedure including the risks, benefits and alternatives for the proposed anesthesia with the patient or authorized representative who has indicated his/her understanding and acceptance.        Plan Discussed with:   Anesthesia Plan Comments: (40.1  wk G1P0 for LEA)        Anesthesia Quick Evaluation

## 2020-05-31 NOTE — Progress Notes (Signed)
S: [name] is a 30 y.o. G1P1001 at [redacted]w[redacted]d  who presents to MAU today complaining contractions q 2-3 minutes since 7pm last night. She endorses vaginal bleeding. She endorses LOF just since RN's vaginal exam (leaking down her leg she is unsure if it is mucus, urine or amnio. She reports normal fetal movement.    O: BP 110/73 (BP Location: Left Arm)   Pulse 69   Temp 98.4 F (36.9 C) (Oral)   Resp 18   Ht 5\' 6"  (1.676 m)   Wt 259 lb 6.4 oz (117.7 kg)   LMP 08/24/2019 (Exact Date)   SpO2 98%   Breastfeeding Unknown   BMI 41.87 kg/m  GENERAL: Well-developed, well-nourished female in mild distress.  HEAD: Normocephalic, atraumatic.  CHEST: Normal effort of breathing, regular heart rate ABDOMEN: Soft, nontender, gravid  Cervical exam:  1/80/-2 per RN  07-10-1972 test negative, appears to be cervical mucus  Fetal Monitoring: pt refused extended monitoring, reactive for the 3-70min she was on the monitor Baseline: 145 Variability: moderate Accelerations: present Decelerations: none Contractions: refused contraction monitor, reports q2-24min and strong  Counseled pt that she is still in latent labor and we do not recommend admission unless active labor or another medical indication. Pt verbalized understanding but is concerned about the amount of pain she is in and her exhaustion. Offered a fluid bolus in MAU then discharge with therapeutic rest. Pt discussed with mother but declined because she is concerned about ability to get back in a timely manner. Pt requests to stay for elective IOL at term and will allow augmentation if needed. Spoke with L&D team 1m Cresenzo-Dishmon, CNM) who agreed pt needed to stay for rehydration and therapeutic rest.  A: SIUP at [redacted]w[redacted]d  Prolonged latent labor with poor pain control  P: Admit to L&D for therapeutic rest and expectant management, augmentation if needed Admission and IOL orders signed/held  [redacted]w[redacted]d, CNM, MSN, IBCLC Certified  Nurse Midwife, [facility]

## 2020-05-31 NOTE — Lactation Note (Signed)
This note was copied from a baby's chart. Lactation Consultation Note  Patient Name: Kathryn Fleming [id number] Date: 05/31/2020 Reason for consult: Initial assessment Age:32 hours Mother is a P60, Mother was given [facility] brochure and basic teaching done.  Mother reports that infant is feeding well.  Reviewed hand expression with mother. Observed a few  drops of colostrum.  Mother request assistance with positioning using football hold. . Infant placed on support pillows and boppy. Infant had been fed an hour ago and was sleepy. Infant un-swaddled and undressed and still uninterested in a feeding.  Mother placed the infant STS and she plans to page for latch check the next feeding.   Mother to continue to cue base feed infant and feed at least 8-12 times or more in 24 hours and advised to allow for cluster feeding infant as needed.  Mother to continue to due STS. Mother is aware of available LC services at [facility], BFSG'S, OP Dept, and phone # for questions or concerns about breastfeeding.  Mother receptive to all teaching and plan of care.    Maternal Data Has patient been taught Hand Expression?: Yes  Feeding Mother's Current Feeding Choice: Breast Milk  LATCH Score                    Lactation Tools Discussed/Used    Interventions Interventions: Hand express;Skin to skin;Assisted with latch;Adjust position;Support pillows;Education  Discharge    Consult Status Consult Status: Follow-up Date: 05/31/20 Follow-up type: In-patient    Stevan Born [facility] 05/31/2020, 1:19 PM

## 2020-05-31 NOTE — Anesthesia Postprocedure Evaluation (Signed)
Anesthesia Post Note  Patient: Kathryn Fleming  Procedure(s) Performed: AN AD HOC LABOR EPIDURAL     Patient location during evaluation: Mother Baby Anesthesia Type: Epidural Level of consciousness: awake, awake and alert and oriented Pain management: pain level controlled Vital Signs Assessment: post-procedure vital signs reviewed and stable Respiratory status: spontaneous breathing Cardiovascular status: blood pressure returned to baseline Postop Assessment: no headache, no backache, adequate PO intake, able to ambulate and no apparent nausea or vomiting Anesthetic complications: no Comments: Pt states epidural didn't have time to set up prior to delivery - had to wait on labs in MAU, progressed quickly according to patient    No complications documented.  Last Vitals:  Vitals:   05/31/20 1104 05/31/20 1701  BP: 116/73 110/73  Pulse: 74 69  Resp: 18 18  Temp: 36.8 C 36.9 C  SpO2: 98% 98%    Last Pain:  Vitals:   05/31/20 1701  TempSrc: Oral  PainSc:    Pain Goal: Patients Stated Pain Goal: 6 (05/31/20 0005)                 Jennelle Human

## 2020-05-31 NOTE — Anesthesia Procedure Notes (Signed)
Epidural Patient location during procedure: OB Start time: 05/31/2020 1:41 AM End time: 05/31/2020 2:02 AM  Staffing Anesthesiologist: Trevor Iha, MD Performed: anesthesiologist   Preanesthetic Checklist Completed: patient identified, IV checked, site marked, risks and benefits discussed, surgical consent, monitors and equipment checked, pre-op evaluation and timeout performed  Epidural Patient position: sitting Prep: DuraPrep and site prepped and draped Patient monitoring: continuous pulse ox and blood pressure Approach: midline Location: L3-L4 Injection technique: LOR air  Needle:  Needle type: Tuohy  Needle gauge: 17 G Needle length: 9 cm and 9 Needle insertion depth: 9 cm Catheter type: closed end flexible Catheter size: 19 Gauge Catheter at skin depth: 15 cm Test dose: negative  Assessment Events: blood not aspirated, injection not painful, no injection resistance, no paresthesia and negative IV test  Additional Notes Patient identified. Risks/Benefits/Options discussed with patient including but not limited to bleeding, infection, nerve damage, paralysis, failed block, incomplete pain control, headache, blood pressure changes, nausea, vomiting, reactions to medication both or allergic, itching and postpartum back pain. Confirmed with bedside nurse the patient's most recent platelet count. Confirmed with patient that they are not currently taking any anticoagulation, have any bleeding history or any family history of bleeding disorders. Patient expressed understanding and wished to proceed. All questions were answered. Sterile technique was used throughout the entire procedure. Please see nursing notes for vital signs. Test dose was given through epidural needle and negative prior to continuing to dose epidural or start infusion. Warning signs of high block given to the patient including shortness of breath, tingling/numbness in hands, complete motor block, or any  concerning symptoms with instructions to call for help. Patient was given instructions on fall risk and not to get out of bed. All questions and concerns addressed with instructions to call with any issues. 2 Attempt (S) . Patient tolerated procedure well.

## 2020-05-31 NOTE — Discharge Summary (Signed)
Postpartum Discharge Summary    Patient Name: Kathryn Fleming DOB: [birth year] MRN: [id number]  Date of admission: 05/30/2020 Delivery date:05/31/2020  Delivering provider: Christin Fudge  Date of discharge: 06/01/2020  Admitting diagnosis: Indication for care in labor or delivery [O75.9] Intrauterine pregnancy: [redacted]w[redacted]d     Secondary diagnosis:  Active Problems:   Hypothyroidism   Postsurgical hypothyroidism   Abnormal chromosomal and genetic finding on antenatal screening mother   COVID-19 affecting pregnancy, antepartum   BMI 45.0-49.9, adult ([place])   Indication for care in labor or delivery   Vaginal delivery  Additional problems: as noted above  Discharge diagnosis: Term Pregnancy Delivered                                              Post partum procedures:none Augmentation: none Complications: None  Hospital course: Onset of Labor With Vaginal Delivery      30 y.o. yo G1P0000 at [redacted]w[redacted]d was admitted in Latent Labor on 05/30/2020. Patient had an uncomplicated labor course as follows:  Membrane Rupture Time/Date: 11:50 PM ,05/30/2020   Delivery Method:Vaginal, Spontaneous  Episiotomy: None  Lacerations:  2nd degree  Patient had an uncomplicated postpartum course.  She is ambulating, tolerating a regular diet, passing flatus, and urinating well. Patient is discharged home in stable condition on 06/01/20.  Newborn Data: Birth date:05/31/2020  Birth time:2:51 AM  Gender:Female  Living status:Living  Apgars:9 ,9  Weight:3181 g   Magnesium Sulfate received: No BMZ received: No Rhophylac:N/A MMR:N/A T-DaP:Given prenatally Flu: Yes Transfusion:No  Physical exam  Vitals:   05/31/20 1104 05/31/20 1701 05/31/20 2100 06/01/20 0721  BP: 116/73 110/73 121/76 99/74  Pulse: 74 69 88 68  Resp: 18 18    Temp: 98.3 F (36.8 C) 98.4 F (36.9 C) 98.5 F (36.9 C) 98.2 F (36.8 C)  TempSrc: Oral Oral Oral Oral  SpO2: 98% 98% 97% 99%  Weight:      Height:        General: alert, cooperative and no distress Lochia: appropriate Uterine Fundus: firm Incision: N/A DVT Evaluation: No evidence of DVT seen on physical exam. No cords or calf tenderness. No significant calf/ankle edema. Labs: Lab Results  Component Value Date   WBC 12.2 (H) 05/31/2020   HGB 13.7 05/31/2020   HCT 40.3 05/31/2020   MCV 91.0 05/31/2020   PLT 331 05/31/2020   CMP Latest Ref Rng & Units 05/11/2020  Glucose 70 - 99 mg/dL 91  BUN 6 - 20 mg/dL 9  Creatinine 0.44 - 1.00 mg/dL 0.49  Sodium 135 - 145 mmol/L 133(L)  Potassium 3.5 - 5.1 mmol/L 3.7  Chloride 98 - 111 mmol/L 104  CO2 22 - 32 mmol/L 18(L)  Calcium 8.9 - 10.3 mg/dL 8.8(L)  Total Protein 6.5 - 8.1 g/dL 6.3(L)  Total Bilirubin 0.3 - 1.2 mg/dL 0.5  Alkaline Phos 38 - 126 U/L 145(H)  AST 15 - 41 U/L 18  ALT 0 - 44 U/L 24   Edinburgh Score: Edinburgh Postnatal Depression Scale Screening Tool 06/01/2020  I have been able to laugh and see the funny side of things. 0  I have looked forward with enjoyment to things. 0  I have blamed myself unnecessarily when things went wrong. 0  I have been anxious or worried for no good reason. 0  I have felt scared or panicky for no good reason.  0  Things have been getting on top of me. 1  I have been so unhappy that I have had difficulty sleeping. 0  I have felt sad or miserable. 0  I have been so unhappy that I have been crying. 0  The thought of harming myself has occurred to me. 1  Edinburgh Postnatal Depression Scale Total 2     After visit meds:  Allergies as of 06/01/2020      Reactions   Levothyroxine Rash      Medication List    STOP taking these medications   famotidine 20 MG tablet Commonly known as: PEPCID   nystatin 100000 UNIT/ML suspension Commonly known as: MYCOSTATIN   ondansetron 4 MG disintegrating tablet Commonly known as: ZOFRAN-ODT     TAKE these medications   acetaminophen 500 MG tablet Commonly known as: TYLENOL Take 500 mg by  mouth every 6 (six) hours as needed.   coconut oil Oil Apply 1 application topically as needed (nipple pain).   ferrous sulfate 325 (65 FE) MG tablet Take 1 tablet (325 mg total) by mouth every other day. Start taking on: Jun 02, 2020   ibuprofen 600 MG tablet Commonly known as: ADVIL Take 1 tablet (600 mg total) by mouth every 6 (six) hours.   prenatal multivitamin Tabs tablet Take 1 tablet by mouth daily at 12 noon.   Synthroid 200 MCG tablet Generic drug: levothyroxine Take 1 tablet (200 mcg total) by mouth daily before breakfast. What changed: Another medication with the same name was added. Make sure you understand how and when to take each.   levothyroxine 200 MCG tablet Commonly known as: SYNTHROID Take 1 tablet (200 mcg total) by mouth daily at 6 (six) AM. What changed: You were already taking a medication with the same name, and this prescription was added. Make sure you understand how and when to take each.        Discharge home in stable condition Infant Feeding: Breast Infant Disposition:home with mother Discharge instruction: per After Visit Summary and Postpartum booklet. Activity: Advance as tolerated. Pelvic rest for 6 weeks.  Diet: routine diet Future Appointments: Future Appointments  Date Time Provider [place]  06/03/2020  8:15 AM [facility]  07/03/2020 10:55 AM Gabriel Carina, CNM [facility] [place]  07/24/2020 10:30 AM Shamleffer, Melanie Crazier, MD [facility] None   Follow up Visit:  [place] [facility] [place] at [facility] Follow up.   Specialty: Obstetrics and Gynecology Why: As scheduled for postpartum appointment Contact information: [place] [address] [phone]             Message sent to [facility] by Dr. Astrid Drafts.  Please schedule this patient for a In person postpartum visit in 4 weeks with the following provider: Any  provider. 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

## 2020-06-01 MED ORDER — LEVOTHYROXINE SODIUM 100 MCG PO TABS
200.0000 ug | ORAL_TABLET | Freq: Every day | ORAL | Status: DC
  Filled 2020-06-01 (×2): qty 2

## 2020-06-01 MED ORDER — FERROUS SULFATE 325 (65 FE) MG PO TABS: 325.0000 mg | ORAL_TABLET | ORAL | 2 refills | Status: DC

## 2020-06-01 MED ORDER — COCONUT OIL OIL: 1.0000 "application " | TOPICAL_OIL | 0 refills | Status: DC | PRN

## 2020-06-01 MED ORDER — IBUPROFEN 600 MG PO TABS: 600.0000 mg | ORAL_TABLET | Freq: Four times a day (QID) | ORAL | 0 refills | Status: DC

## 2020-06-01 MED ORDER — LEVOTHYROXINE SODIUM 200 MCG PO TABS: 200.0000 ug | ORAL_TABLET | Freq: Every day | ORAL | 0 refills | Status: DC

## 2020-06-01 NOTE — Discharge Instructions (Signed)

## 2020-06-01 NOTE — Lactation Note (Signed)
This note was copied from a baby's chart. Lactation Consultation Note  Patient Name: Kathryn Fleming Date: 06/01/2020 Reason for consult: Follow-up assessment;Primapara;1st time breastfeeding;Term Age:32 hours   1110 - 1125 - I followed up with Ms. Pankonin. She states that baby "Anya" is breast feeding well thus far. She indicated in her feeding log that baby has been eating every 2-3 hours for about 10-20 minutes/feeding. Ms. Charnley would like to be discharged today pending baby's serum bilirubin levels come back WNL. She states that she has been cleared for discharge otherwise.  I reviewed signs of a good feeding and provided discharge education. I recommended that she breast feed baby 8-12 times a day on demand, waking baby to feed as needed. I encouraged her to offer both breasts in a feeding. I spent some time educating on foremilk/hindmilk, and softening the breasts in a feeding.   Ms. Havlik plans to [facility]. She states that she attended a lot of the Prenatal classes, and she's interested in the support groups. I reviewed our community breast feeding resources and ensured that she had a breast feeding brochure.   Ms. Blinn declined latch assistance, citing baby fed around 0930. I offered to return to assist with breast feeding, as needed.   Maternal Data Does the patient have breastfeeding experience prior to this delivery?: No (has taken classes)  Feeding Mother's Current Feeding Choice: Breast Milk  LATCH Score Latch: Grasps breast easily, tongue down, lips flanged, rhythmical sucking.  Audible Swallowing: Spontaneous and intermittent  Type of Nipple: Everted at rest and after stimulation  Comfort (Breast/Nipple): Soft / non-tender  Hold (Positioning): No assistance needed to correctly position infant at breast.  LATCH Score: 10   Lactation Tools Discussed/Used    Interventions Interventions: Breast feeding  basics reviewed;Education  Discharge Discharge Education: Warning signs for feeding baby;Outpatient recommendation  Consult Status Consult Status: Follow-up Date: 06/02/20 Follow-up type: In-patient    Kathryn Fleming 06/01/2020, 11:32 AM

## 2020-06-01 NOTE — Plan of Care (Signed)
Discharge instructions given, pt receptive.  

## 2020-06-03 ENCOUNTER — Other Ambulatory Visit: Payer: BC Managed Care – PPO

## 2020-06-05 ENCOUNTER — Other Ambulatory Visit: Payer: Self-pay | Admitting: Certified Nurse Midwife

## 2020-06-05 NOTE — Progress Notes (Signed)
Error

## 2020-07-03 ENCOUNTER — Ambulatory Visit (INDEPENDENT_AMBULATORY_CARE_PROVIDER_SITE_OTHER): Payer: BC Managed Care – PPO | Admitting: Certified Nurse Midwife

## 2020-07-03 ENCOUNTER — Encounter: Payer: Self-pay | Admitting: Certified Nurse Midwife

## 2020-07-03 ENCOUNTER — Other Ambulatory Visit: Payer: Self-pay

## 2020-07-03 MED ORDER — BREAST PUMP MISC: 0 refills | Status: DC

## 2020-07-03 NOTE — Progress Notes (Signed)
Post Partum Visit Note  Kathryn Fleming is a 30 y.o. G50P1001 female who presents for a postpartum visit. She is 4 weeks postpartum following a normal spontaneous vaginal delivery.  I have fully reviewed the prenatal and intrapartum course. The delivery was at 40.1 gestational weeks.  Anesthesia: epidural. Postpartum course has been good with plenty of family support. Baby is doing well. Baby is feeding by breast. Bleeding staining only and changing a maxi pad every 8 hours. Bowel function is normal. Bladder function is normal. Patient is not sexually active. Contraception method is none. Postpartum depression screening: negative.   The pregnancy intention screening data noted above was reviewed. Potential methods of contraception were discussed. The patient elected to proceed with FAM or LAM.    Edinburgh Postnatal Depression Scale - 07/03/20 1110      Edinburgh Postnatal Depression Scale:  In the Past 7 Days   I have been able to laugh and see the funny side of things. 0    I have looked forward with enjoyment to things. 0    I have blamed myself unnecessarily when things went wrong. 0    I have been anxious or worried for no good reason. 3    I have felt scared or panicky for no good reason. 0    Things have been getting on top of me. 0    I have been so unhappy that I have had difficulty sleeping. 0    I have felt sad or miserable. 0    I have been so unhappy that I have been crying. 1    The thought of harming myself has occurred to me. 0    Edinburgh Postnatal Depression Scale Total 4           Health Maintenance Due  Topic Date Due  . COVID-19 Vaccine (3 - Booster for Moderna series) 10/25/2019    The following portions of the patient's history were reviewed and updated as appropriate: allergies, current medications, past family history, past medical history, past social history, past surgical history and problem list.  Review of Systems Pertinent items noted in HPI and  remainder of comprehensive ROS otherwise negative.  Objective:  BP 109/73   Pulse 83   Wt 243 lb 9.6 oz (110.5 kg)   LMP 08/24/2019 (Exact Date)   Breastfeeding Yes   BMI 39.32 kg/m    General:  alert, cooperative, appears stated age and no distress   Breasts:  normal  Lungs: normal effort  Heart:  regular rate and rhythm  Abdomen: soft, non-tender   Wound N/A  GU exam:  not indicated - still having some tenderness, prefers to wait and follow up for exam if needed. Will need repeat pap in 30mo.       Assessment:   Normal postpartum exam.   Plan:   Essential components of care per ACOG recommendations:  1.  Mood and well being: Patient with negative depression screening today. Reviewed local resources for support.  - Patient tobacco use? No.   - hx of drug use? No.    2. Infant care and feeding:  -Patient currently breastmilk feeding? Yes. Reviewed importance of draining breast regularly to support lactation.  -Social determinants of health (SDOH) reviewed in EPIC. No concerns  3. Sexuality, contraception and birth spacing - Patient does not want a pregnancy in the next year.  Desired family size is 1 children.  - Reviewed forms of contraception in tiered fashion. Patient desired natural family  planning (NFP) today.    4. Sleep and fatigue -Encouraged family/partner/community support of 4 hrs of uninterrupted sleep to help with mood and fatigue  5. Physical Recovery  - Discussed patients delivery and complications. She describes her labor as mixed. - Patient had a Vaginal, no problems at delivery. Patient had a 2nd degree laceration. Perineal healing reviewed. Patient expressed understanding - Patient has urinary incontinence? No. - Patient is not safe to resume physical (yes) and sexual activity (when ready)  6.  Health Maintenance - HM due items addressed Yes - Last pap smear abnormal due to +HPV (not 16/18/45) but NILM - Pap smear not done at today's visit. Will  follow up after 09/26/20 for repeat pap as indicated by ASCCP guidelines. -Breast Cancer screening indicated? No.   7. Chronic Disease/Pregnancy Condition follow up: None - PCP follow up as needed  Edd Arbour, CNM, MSN, IBCLC Certified Nurse Midwife, [facility]

## 2020-07-24 ENCOUNTER — Encounter: Payer: Self-pay | Admitting: Internal Medicine

## 2020-07-24 ENCOUNTER — Ambulatory Visit (INDEPENDENT_AMBULATORY_CARE_PROVIDER_SITE_OTHER): Payer: BC Managed Care – PPO | Admitting: Internal Medicine

## 2020-07-24 ENCOUNTER — Other Ambulatory Visit: Payer: Self-pay

## 2020-07-24 VITALS — BP 110/68 | HR 86 | Ht 66.0 in | Wt 250.0 lb

## 2020-07-24 DIAGNOSIS — E890 Postprocedural hypothyroidism: Secondary | ICD-10-CM

## 2020-07-24 LAB — TSH: TSH: 0.12 u[IU]/mL — ABNORMAL LOW (ref 0.35–4.50)

## 2020-07-24 NOTE — Progress Notes (Signed)
Name: Roslyn Else  MRN/ DOB: [id number], 07-03-1990    Age/ Sex: 30 y.o., female     PCP: Patient, No Pcp Per (Inactive)   Reason for Endocrinology Evaluation: Hypothyrodism     Initial Endocrinology Clinic Visit: 10/27/2019    PATIENT IDENTIFIER: Ms. Montrice Montuori is a 30 y.o., female with a past medical history of hypothyroidism. 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 Has been on LT- 4 replacement since then   She is a psych NP    On her initial visit to our clinic she was pregnant in first trimester   S/P delivery on 05/11/2020 - Luiz Blare     SUBJECTIVE:    Today (07/24/2020):  Ms. Angell is here for a follow up on hypothyroidism .   S/P delivery on 05/31/2020- Luiz Blare  She has been noted with weight loss since her delivery   Denies constipation  Denies local neck symptoms      Synthroid 200 MCG daily     HISTORY:  Past Medical History:  Past Medical History:  Diagnosis Date   Abnormal chromosomal and genetic finding on antenatal screening mother 02/07/2020   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 for Fragile X Syndrome- Had genetic counseling    Constipation 02/08/2020   COVID-19 affecting pregnancy, antepartum 03/11/2020   Covid in Jan  2022   Hypothyroidism    Indication for care in labor or delivery 05/30/2020   Thrush 03/11/2020   Vaginal delivery 05/31/2020   Past Surgical History:  Past Surgical History:  Procedure Laterality Date   BANKART REPAIR  2014   latarjet repair  2017   THYROIDECTOMY  2010   Social History:  reports that she has never smoked. She has never used smokeless tobacco. She reports previous alcohol use. She reports that she does not use drugs. Family History:  Family History  Problem Relation Age of Onset   Multiple sclerosis Mother     Hypertension Mother    Obesity Mother    Heart disease Father    Diabetes Father    Atrial fibrillation Father    Obesity Father    Kidney cancer Father      HOME MEDICATIONS: Allergies as of 07/24/2020       Reactions   Levothyroxine Rash        Medication List        Accurate as of July 24, 2020 11:08 AM. If you have any questions, ask your nurse or doctor.          Breast Pump Misc Dispense one breast pump for patient   prenatal multivitamin Tabs tablet Take 1 tablet by mouth daily at 12 noon.   Synthroid 200 MCG tablet Generic drug: levothyroxine Take 1 tablet (200 mcg total) by mouth daily before breakfast.          OBJECTIVE:   PHYSICAL EXAM: VS: BP 110/68   Pulse 86   Ht 5\' 6"  (1.676 m)   Wt 250 lb (113.4 kg)   SpO2 99%   BMI 40.35 kg/m    EXAM: General: Pt appears well and is in NAD  Neck: General: Supple without adenopathy. Thyroid: No goiter or nodules appreciated.   Lungs: Clear with good BS bilat with no rales, rhonchi, or wheezes  Heart: Auscultation: RRR.  Abdomen: Normoactive bowel sounds, soft, nontender, without  masses or organomegaly palpable  Extremities:  BL LE: No pretibial edema normal ROM and strength.  Mental Status: Judgment, insight: Intact Memory: Intact for recent and remote events Mood and affect: No depression, anxiety, or agitation     DATA REVIEWED:  Results for JASMIN, WINBERRY (MRN [id number]) as of 07/25/2020 12:33  Ref. Range 07/24/2020 11:18  TSH Latest Ref Range: 0.35 - 4.50 uIU/mL 0.12 (L)    ASSESSMENT / PLAN / RECOMMENDATIONS:   Post- surgical hypothyroidism:  - Pt is clinically euthyroid  - No local neck symptoms  -TSH is low we will reduce levothyroxine dose as below   Medications  Synthroid 200 , half a tablet on Sundays and 1 tablet the rest of the week    F/u in 6 months Labs in 8 weeks   Signed electronically by: Lyndle Herrlich, MD  [facility] Endocrinology  [facility] [address] [place], [place] [address] Phone: [phone] FAX: [phone]      CC: Patient, No Pcp Per (Inactive) No address on file Phone: None  Fax: None   Return to Endocrinology clinic as below: No future appointments.

## 2020-11-08 ENCOUNTER — Encounter: Payer: Self-pay | Admitting: Radiology

## 2020-11-11 ENCOUNTER — Other Ambulatory Visit: Payer: Self-pay | Admitting: Internal Medicine

## 2021-01-22 ENCOUNTER — Ambulatory Visit: Payer: BC Managed Care – PPO | Admitting: Internal Medicine

## 2021-02-12 ENCOUNTER — Other Ambulatory Visit: Payer: Self-pay

## 2021-02-12 ENCOUNTER — Other Ambulatory Visit (INDEPENDENT_AMBULATORY_CARE_PROVIDER_SITE_OTHER): Payer: BC Managed Care – PPO

## 2021-02-12 DIAGNOSIS — E890 Postprocedural hypothyroidism: Secondary | ICD-10-CM | POA: Diagnosis not present

## 2021-02-12 LAB — TSH: TSH: 2.17 u[IU]/mL (ref 0.35–5.50)

## 2021-02-12 NOTE — Progress Notes (Signed)
Name: Laurabelle Garand  MRN/ DOB: [id number], 1990-08-08    Age/ Sex: 31 y.o., female     PCP: Patient, No Pcp Per (Inactive)   Reason for Endocrinology Evaluation: Hypothyrodism     Initial Endocrinology Clinic Visit: 10/27/2019    PATIENT IDENTIFIER: Ms. Kristeena Guillemette is a 31 y.o., female with a past medical history of hypothyroidism. She has followed with [facility] since 10/27/2019 for consultative assistance with management of her hypothyroidism    HISTORICAL SUMMARY:  She is S/P thyroidectomy secondary to [place] in 2010 with benign pathology . Has been on LT- 4 replacement since then   She is a psych NP    On her initial visit to our clinic she was pregnant in first trimester   S/P delivery on 05/11/2020 - [place]:    Today (02/13/2021):  Ms. Knippel is here for a follow up on hypothyroidism .   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 She has never used smokeless tobacco. She reports that she does not currently use alcohol. She reports that she does not use drugs. Family History:  Family History  Problem Relation Age of Onset    Multiple sclerosis Mother    Hypertension Mother    Obesity Mother    Heart disease Father    Diabetes Father    Atrial fibrillation Father    Obesity Father    Kidney cancer Father      HOME MEDICATIONS: Allergies as of 02/13/2021       Reactions   Levothyroxine Rash        Medication List        Accurate as of February 13, 2021  7:11 AM. If you have any questions, ask your nurse or doctor.          Breast Pump Misc Dispense one breast pump for patient   prenatal multivitamin Tabs tablet Take 1 tablet by mouth daily at 12 noon.   Synthroid 200 MCG tablet Generic drug: levothyroxine TAKE ONE TABLET BY MOUTH ONE TIME DAILY  before breakfast          OBJECTIVE:   PHYSICAL EXAM: VS: There were no vitals taken for this visit.   EXAM: General: Pt appears well and is in NAD  Neck: General: Supple without adenopathy. Thyroid: No goiter or nodules appreciated.   Lungs: Clear with good BS bilat with no rales, rhonchi, or wheezes  Heart: Auscultation: RRR.  Abdomen: Normoactive bowel sounds, soft, nontender, without masses or organomegaly palpable  Extremities:  BL LE: No pretibial edema normal ROM  and strength.  Mental Status: Judgment, insight: Intact Memory: Intact for recent and remote events Mood and affect: No depression, anxiety, or agitation     DATA REVIEWED:  Latest Reference Range & Units 02/12/21 11:20  TSH 0.35 - 5.50 uIU/mL 2.17      ASSESSMENT / PLAN / RECOMMENDATIONS:   Post- surgical hypothyroidism:   - No local neck symptoms  -TSH is normal, no change    Medications  Synthroid 200 , daily    F/U in 1 yr   Signed electronically by: Mack Guise, MD  [facility] End[facility]in[facility]  [facility] [address] [place], [place] [address] Phone: [phone] FAX: [phone]      CC: Patient, No Pcp Per (Inactive) No address on file Phone: None  Fax: None   Return to Endocrinology  clinic as below: Future Appointments  Date Time Provider [place]  02/13/2021  7:30 AM Neva Ramaswamy, Melanie Crazier, MD [facility] None

## 2021-02-13 ENCOUNTER — Encounter: Payer: Self-pay | Admitting: Internal Medicine

## 2021-02-13 ENCOUNTER — Ambulatory Visit (INDEPENDENT_AMBULATORY_CARE_PROVIDER_SITE_OTHER): Payer: BC Managed Care – PPO | Admitting: Internal Medicine

## 2021-02-13 VITALS — BP 118/82 | HR 71 | Ht 66.0 in | Wt 260.0 lb

## 2021-02-13 DIAGNOSIS — E890 Postprocedural hypothyroidism: Secondary | ICD-10-CM

## 2021-02-13 MED ORDER — SYNTHROID 200 MCG PO TABS: 200.0000 ug | ORAL_TABLET | Freq: Every day | ORAL | 3 refills | Status: DC

## 2021-02-13 NOTE — Patient Instructions (Signed)
-   Continue Synthroid 200 mcg daily

## 2021-07-30 ENCOUNTER — Ambulatory Visit: Payer: BC Managed Care – PPO | Admitting: Certified Nurse Midwife

## 2021-08-27 ENCOUNTER — Other Ambulatory Visit (HOSPITAL_COMMUNITY)
Admission: RE | Admit: 2021-08-27 | Discharge: 2021-08-27 | Disposition: A | Discharge status: Home / Self Care | Payer: Commercial Managed Care - PPO | Source: Ambulatory Visit | Attending: Certified Nurse Midwife | Admitting: Certified Nurse Midwife

## 2021-08-27 ENCOUNTER — Encounter: Payer: Self-pay | Admitting: Certified Nurse Midwife

## 2021-08-27 ENCOUNTER — Ambulatory Visit (INDEPENDENT_AMBULATORY_CARE_PROVIDER_SITE_OTHER): Payer: Commercial Managed Care - PPO | Admitting: Certified Nurse Midwife

## 2021-08-27 VITALS — BP 116/80 | HR 104 | Ht 66.0 in | Wt 262.7 lb

## 2021-08-27 DIAGNOSIS — Z01419 Encounter for gynecological examination (general) (routine) without abnormal findings: Secondary | ICD-10-CM | POA: Diagnosis present

## 2021-08-28 NOTE — Progress Notes (Signed)
ANNUAL EXAM Patient name: Kathryn Fleming MRN [id number]  Date of birth: 1990-12-17 Chief Complaint:   Gynecologic Exam  History of Present Illness:   Kathryn Fleming is a 31 y.o. G3P1001  European-descent  female being seen today for a routine annual exam.  Current complaints: None  Patient's last menstrual period was 08/22/2021 (approximate).  The pregnancy intention screening data noted above was reviewed. Potential methods of contraception were discussed. The patient elected to proceed with Female Condom.   Last pap 09/27/19. Results were: NILM w/ HRHPV positive: other (not 16, 18/45). H/O abnormal pap: no Last mammogram: None (age). Results were: N/A. Family h/o breast cancer: no Last colonoscopy: None (age). Results were: N/A. Family h/o colorectal cancer: no     08/27/2021    5:14 PM 07/03/2020   11:49 AM 06/04/2020    4:53 PM 05/22/2020   10:06 AM 05/08/2020   12:28 PM  Depression screen PHQ 2/9  Decreased Interest 0 0 0 0 0  Down, Depressed, Hopeless 0 0 0 0 0  PHQ - 2 Score 0 0 0 0 0  Altered sleeping 0 0 2 2 3   Tired, decreased energy 0 0 1 2 2   Change in appetite 0 0 0 0 1  Feeling bad or failure about yourself  0 0 0 0 0  Trouble concentrating 0 0 0 0 0  Moving slowly or fidgety/restless 0 0 0 0 0  Suicidal thoughts 0 0 0 0 0  PHQ-9 Score 0 0 3 4 6         08/27/2021    5:14 PM 07/03/2020   11:50 AM 06/04/2020    4:53 PM 05/22/2020   10:06 AM  GAD 7 : Generalized Anxiety Score  Nervous, Anxious, on Edge 0 0 1 1  Control/stop worrying 0 0 0 0  Worry too much - different things 0 0 0 0  Trouble relaxing 0 0 0 0  Restless 0 0 0 0  Easily annoyed or irritable 0 0 1 2  Afraid - awful might happen 0 0 0 0  Total GAD 7 Score 0 0 2 3     Review of Systems:   Pertinent items are noted in HPI Denies any headaches, blurred vision, fatigue, shortness of breath, chest pain, abdominal pain, abnormal vaginal discharge/itching/odor/irritation, problems with periods, bowel  movements, urination, or intercourse unless otherwise stated above. Pertinent History Reviewed:  Reviewed past medical,surgical, social and family history.  Reviewed problem list, medications and allergies. Physical Assessment:   Vitals:   08/27/21 1549  BP: 116/80  Pulse: (!) 104  Weight: 262 lb 11.2 oz (119.2 kg)  Height: 5\' 6"  (1.676 m)   Body mass index is 42.4 kg/m.   Physical Examination:  General appearance - well appearing, and in no distress Mental status - alert, oriented to person, place, and time Psych:  She has a normal mood and affect  Skin - warm and dry, normal color, no suspicious lesions noted Chest - effort normal Heart - normal rate and regular rhythm Neck:  midline trachea, no thyromegaly or nodules Breasts - breasts appear normal, no suspicious masses, no skin or nipple changes or axillary nodes Abdomen - soft, nontender, nondistended, no masses or organomegaly Pelvic - VULVA: normal appearing vulva with no masses, tenderness or lesions  VAGINA: normal appearing vagina with normal color and discharge, no lesions  CERVIX: normal appearing cervix without discharge or lesions, no CMT Thin prep pap is done with HR HPV cotesting Extremities:  No swelling or varicosities noted  Chaperone present for exam  No results found for this or any previous visit (from the past 24 hour(s)).  Assessment & Plan:  1. Women's annual routine gynecological examination - Cytology - PAP( [place]) - Will follow up results of pap smear and manage accordingly. -Routine preventative health maintenance measures emphasized. - Mammogram: @ 33yo, or sooner if problems - Colonoscopy: @ 31yo, or sooner if problems  Meds: No orders of the defined types were placed in this encounter.  Follow-up: Return in about 1 year (around 08/28/2022), or as needed, for ANN.  Bernerd Limbo, CNM 08/28/2021 10:21 PM

## 2021-09-04 LAB — CYTOLOGY - PAP
Comment: NEGATIVE
Diagnosis: REACTIVE
High risk HPV: NEGATIVE

## 2021-11-21 ENCOUNTER — Encounter: Payer: Self-pay | Admitting: Nurse Practitioner

## 2021-11-21 ENCOUNTER — Ambulatory Visit (INDEPENDENT_AMBULATORY_CARE_PROVIDER_SITE_OTHER): Payer: Commercial Managed Care - PPO | Admitting: Nurse Practitioner

## 2021-11-21 VITALS — BP 116/88 | HR 76 | Temp 98.0°F | Ht 66.0 in | Wt 269.0 lb

## 2021-11-21 DIAGNOSIS — E890 Postprocedural hypothyroidism: Secondary | ICD-10-CM

## 2021-11-21 DIAGNOSIS — Z0000 Encounter for general adult medical examination without abnormal findings: Secondary | ICD-10-CM

## 2021-11-21 DIAGNOSIS — Z23 Encounter for immunization: Secondary | ICD-10-CM | POA: Diagnosis not present

## 2021-11-21 DIAGNOSIS — Z13220 Encounter for screening for lipoid disorders: Secondary | ICD-10-CM

## 2021-11-21 NOTE — Progress Notes (Unsigned)
New Patient Visit  BP 116/88   Pulse 76   Temp 98 F (36.7 C) (Temporal)   Ht 5\' 6"  (1.676 m)   Wt 269 lb (122 kg)   LMP 10/29/2021 (Exact Date)   SpO2 99%   Breastfeeding Yes   BMI 43.42 kg/m    Subjective:    Patient ID: 10/31/2021, female    DOB: 1990/02/17, 31 y.o.   MRN: [id number]  CC: Chief Complaint  Patient presents with   Establish Care    Np. Est care. Overall health assessment     HPI: Kathryn Fleming is a 31 y.o. female presents for new patient visit to establish care.  Introduced to 32 role and practice setting.  All questions answered.  Discussed provider/patient relationship and expectations.  She moved from [place] 3 years ago and is looking to establish care with a primary care provider.  She has a history of multiple thyroid nodules which required thyroidectomy and is now taking Synthroid (brand name) [place] daily. She is following with endocrinology.   Depression and Anxiety Screen done:     11/21/2021    4:05 PM 08/27/2021    5:14 PM 07/03/2020   11:49 AM 06/04/2020    4:53 PM 05/22/2020   10:06 AM  Depression screen PHQ 2/9  Decreased Interest 1 0 0 0 0  Down, Depressed, Hopeless 1 0 0 0 0  PHQ - 2 Score 2 0 0 0 0  Altered sleeping 1 0 0 2 2  Tired, decreased energy 0 0 0 1 2  Change in appetite 0 0 0 0 0  Feeling bad or failure about yourself  0 0 0 0 0  Trouble concentrating 0 0 0 0 0  Moving slowly or fidgety/restless 0 0 0 0 0  Suicidal thoughts 0 0 0 0 0  PHQ-9 Score 3 0 0 3 4  Difficult doing work/chores Not difficult at all          11/21/2021    4:05 PM 08/27/2021    5:14 PM 07/03/2020   11:50 AM 06/04/2020    4:53 PM  GAD 7 : Generalized Anxiety Score  Nervous, Anxious, on Edge 0 0 0 1  Control/stop worrying 0 0 0 0  Worry too much - different things 0 0 0 0  Trouble relaxing 0 0 0 0  Restless 0 0 0 0  Easily annoyed or irritable 1 0 0 1  Afraid - awful might happen 0 0 0 0  Total GAD 7 Score 1 0 0 2  Anxiety  Difficulty Not difficult at all       Past Medical History:  Diagnosis Date   Abnormal chromosomal and genetic finding on antenatal screening mother 02/07/2020   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 for 04/06/2020 X Syndrome- Had genetic counseling    Constipation 02/08/2020   COVID-19 affecting pregnancy, antepartum 03/11/2020   Covid in Jan  2022   Hypothyroidism    Indication for care in labor or delivery 05/30/2020   Thrush 03/11/2020   Vaginal delivery 05/31/2020    Past Surgical History:  Procedure Laterality Date   BANKART REPAIR  2014   latarjet repair  2017   THYROIDECTOMY  2010    Family History  Problem Relation Age of Onset   Multiple sclerosis Mother    Hypertension Mother    Obesity Mother    Cancer Father        kidney   Heart disease Father    Diabetes Father  Atrial fibrillation Father    Obesity Father    Kidney cancer Father    Sleep apnea Father    Heart disease Maternal Grandmother    Heart disease Maternal Grandfather    Heart disease Paternal Grandmother    Cancer Paternal Grandfather        leukemia     Social History   Tobacco Use   Smoking status: Never   Smokeless tobacco: Never  Vaping Use   Vaping Use: Never used  Substance Use Topics   Alcohol use: Not Currently   Drug use: Never    Current Outpatient Medications on File Prior to Visit  Medication Sig Dispense Refill   famotidine (PEPCID) 20 MG tablet Take 20 mg by mouth daily as needed for heartburn or indigestion.     SYNTHROID 200 MCG tablet Take 1 tablet (200 mcg total) by mouth daily before breakfast. 90 tablet 3   No current facility-administered medications on file prior to visit.     Review of Systems  Constitutional: Negative.   HENT: Negative.    Respiratory: Negative.    Cardiovascular: Negative.   Gastrointestinal: Negative.   Genitourinary: Negative.   Musculoskeletal: Negative.   Skin: Negative.   Neurological: Negative.   Psychiatric/Behavioral: Negative.           Objective:    BP 116/88   Pulse 76   Temp 98 F (36.7 C) (Temporal)   Ht 5\' 6"  (1.676 m)   Wt 269 lb (122 kg)   LMP 10/29/2021 (Exact Date)   SpO2 99%   Breastfeeding Yes   BMI 43.42 kg/m   Wt Readings from Last 3 Encounters:  11/21/21 269 lb (122 kg)  08/27/21 262 lb 11.2 oz (119.2 kg)  02/13/21 260 lb (117.9 kg)    BP Readings from Last 3 Encounters:  11/21/21 116/88  08/27/21 116/80  02/13/21 118/82    Physical Exam Vitals and nursing note reviewed.  Constitutional:      General: She is not in acute distress.    Appearance: Normal appearance.  HENT:     Head: Normocephalic and atraumatic.     Right Ear: Tympanic membrane, ear canal and external ear normal.     Left Ear: Tympanic membrane, ear canal and external ear normal.  Eyes:     Conjunctiva/sclera: Conjunctivae normal.  Cardiovascular:     Rate and Rhythm: Normal rate and regular rhythm.     Pulses: Normal pulses.     Heart sounds: Normal heart sounds.  Pulmonary:     Effort: Pulmonary effort is normal.     Breath sounds: Normal breath sounds.  Abdominal:     Palpations: Abdomen is soft.     Tenderness: There is no abdominal tenderness.  Musculoskeletal:        General: Normal range of motion.     Cervical back: Normal range of motion and neck supple. No tenderness.     Right lower leg: No edema.     Left lower leg: No edema.  Lymphadenopathy:     Cervical: No cervical adenopathy.  Skin:    General: Skin is warm and dry.  Neurological:     General: No focal deficit present.     Mental Status: She is alert and oriented to person, place, and time.     Cranial Nerves: No cranial nerve deficit.     Coordination: Coordination normal.     Gait: Gait normal.  Psychiatric:        Mood and Affect:  Mood normal.        Behavior: Behavior normal.        Thought Content: Thought content normal.        Judgment: Judgment normal.       Assessment & Plan:   Problem List Items Addressed This Visit        Endocrine   Hypothyroidism    Due to thyroidectomy for multiple nodules. Continue synthroid daily and continue collaboration and recommendations from endocrinology.         Other   Morbid obesity (HCC)    BMI 43.4. Discussed nutrition, exercise.       Other Visit Diagnoses     Routine general medical examination at a health care facility    -  Primary   Health maintenance reviewed and updated. Check CMP, CBC today. Discussed nutrition, exercise. F/U 1 year   Relevant Orders   Comprehensive metabolic panel (Completed)   CBC with Differential/Platelet (Completed)   Screening, lipid       Screen lipid panel today   Relevant Orders   Lipid panel (Completed)   Need for influenza vaccination       Flu vaccine given today   Relevant Orders   Flu Vaccine QUAD 6+ mos PF IM (Fluarix Quad PF) (Completed)       LABORATORY TESTING:  - Pap smear: up to date  IMMUNIZATIONS:   - Tdap: Tetanus vaccination status reviewed: last tetanus booster within 10 years. - Influenza: Administered today - Pneumovax: Not applicable - Prevnar: Not applicable - HPV: Not applicable - Zostavax vaccine: Not applicable  SCREENING: -Mammogram: Not applicable  - Colonoscopy: Not applicable  - Bone Density: Not applicable  -Hearing Test: Not applicable  -Spirometry: Not applicable   PATIENT COUNSELING:   Advised to take 1 mg of folate supplement per day if capable of pregnancy.   Sexuality: Discussed sexually transmitted diseases, partner selection, use of condoms, avoidance of unintended pregnancy  and contraceptive alternatives.   Advised to avoid cigarette smoking.  I discussed with the patient that most people either abstain from alcohol or drink within safe limits (<=14/week and <=4 drinks/occasion for males, <=7/weeks and <= 3 drinks/occasion for females) and that the risk for alcohol disorders and other health effects rises proportionally with the number of drinks per week and how  often a drinker exceeds daily limits.  Discussed cessation/primary prevention of drug use and availability of treatment for abuse.   Diet: Encouraged to adjust caloric intake to maintain  or achieve ideal body weight, to reduce intake of dietary saturated fat and total fat, to limit sodium intake by avoiding high sodium foods and not adding table salt, and to maintain adequate dietary potassium and calcium preferably from fresh fruits, vegetables, and low-fat dairy products.    stressed the importance of regular exercise  Injury prevention: Discussed safety belts, safety helmets, smoke detector, smoking near bedding or upholstery.   Dental health: Discussed importance of regular tooth brushing, flossing, and dental visits.    NEXT PREVENTATIVE PHYSICAL DUE IN 1 YEAR.   Follow up plan: Return in about 1 year (around 11/22/2022) for CPE.

## 2021-11-21 NOTE — Patient Instructions (Signed)
It was great to see you!  We are checking your labs today and will let you know the results via mychart/phone.   Let's follow-up in 1 year, sooner if you have concerns.  If a referral was placed today, you will be contacted for an appointment. Please note that routine referrals can sometimes take up to 3-4 weeks to process. Please call our office if you haven't heard anything after this time frame.  Take care,  Breia Ocampo, NP  

## 2021-11-22 LAB — COMPREHENSIVE METABOLIC PANEL
AG Ratio: 1.5 (calc) (ref 1.0–2.5)
ALT: 31 U/L — ABNORMAL HIGH (ref 6–29)
AST: 17 U/L (ref 10–30)
Albumin: 4.3 g/dL (ref 3.6–5.1)
Alkaline phosphatase (APISO): 67 U/L (ref 31–125)
BUN: 13 mg/dL (ref 7–25)
CO2: 24 mmol/L (ref 20–32)
Calcium: 9.1 mg/dL (ref 8.6–10.2)
Chloride: 103 mmol/L (ref 98–110)
Creat: 0.87 mg/dL (ref 0.50–0.97)
Globulin: 2.9 g/dL (calc) (ref 1.9–3.7)
Glucose, Bld: 86 mg/dL (ref 65–99)
Potassium: 4.2 mmol/L (ref 3.5–5.3)
Sodium: 137 mmol/L (ref 135–146)
Total Bilirubin: 0.3 mg/dL (ref 0.2–1.2)
Total Protein: 7.2 g/dL (ref 6.1–8.1)

## 2021-11-22 LAB — CBC WITH DIFFERENTIAL/PLATELET
Absolute Monocytes: 639 cells/uL (ref 200–950)
Basophils Absolute: 17 cells/uL (ref 0–200)
Basophils Relative: 0.2 %
Eosinophils Absolute: 17 cells/uL (ref 15–500)
Eosinophils Relative: 0.2 %
HCT: 42.9 % (ref 35.0–45.0)
Hemoglobin: 14.6 g/dL (ref 11.7–15.5)
Lymphs Abs: 2656 cells/uL (ref 850–3900)
MCH: 30 pg (ref 27.0–33.0)
MCHC: 34 g/dL (ref 32.0–36.0)
MCV: 88.1 fL (ref 80.0–100.0)
MPV: 10.3 fL (ref 7.5–12.5)
Monocytes Relative: 7.7 %
Neutro Abs: 4972 cells/uL (ref 1500–7800)
Neutrophils Relative %: 59.9 %
Platelets: 332 10*3/uL (ref 140–400)
RBC: 4.87 10*6/uL (ref 3.80–5.10)
RDW: 13.1 % (ref 11.0–15.0)
Total Lymphocyte: 32 %
WBC: 8.3 10*3/uL (ref 3.8–10.8)

## 2021-11-22 LAB — LIPID PANEL
Cholesterol: 185 mg/dL (ref <200)
HDL: 61 mg/dL (ref >=50)
LDL Cholesterol (Calc): 110 mg/dL (calc) — ABNORMAL HIGH
Non-HDL Cholesterol (Calc): 124 mg/dL (calc) (ref <130)
Total CHOL/HDL Ratio: 3 (calc) (ref <5.0)
Triglycerides: 46 mg/dL (ref <150)

## 2021-11-22 NOTE — Assessment & Plan Note (Signed)
Due to thyroidectomy for multiple nodules. Continue synthroid 264mcg daily and continue collaboration and recommendations from endocrinology.

## 2021-11-22 NOTE — Assessment & Plan Note (Signed)
BMI 43.4. Discussed nutrition, exercise.

## 2021-12-15 ENCOUNTER — Encounter: Payer: Self-pay | Admitting: Certified Nurse Midwife

## 2021-12-15 ENCOUNTER — Encounter: Payer: Self-pay | Admitting: *Deleted

## 2021-12-19 ENCOUNTER — Other Ambulatory Visit: Payer: Self-pay

## 2021-12-19 ENCOUNTER — Ambulatory Visit (INDEPENDENT_AMBULATORY_CARE_PROVIDER_SITE_OTHER): Payer: Commercial Managed Care - PPO | Admitting: *Deleted

## 2021-12-19 VITALS — BP 118/81 | HR 95 | Ht 66.0 in | Wt 269.0 lb

## 2021-12-19 DIAGNOSIS — Z30013 Encounter for initial prescription of injectable contraceptive: Secondary | ICD-10-CM

## 2021-12-19 MED ORDER — MEDROXYPROGESTERONE ACETATE 150 MG/ML IM SUSP
150.0000 mg | Freq: Once | INTRAMUSCULAR | Status: AC
  Administered 2021-12-19: 150 mg via INTRAMUSCULAR

## 2021-12-19 NOTE — Progress Notes (Signed)
Pt presents for initiation of Depo Provera birth control - approved by Edd Arbour. Medication was administered without complication. Next injection due 2/2-2/16. Next Annual Gyn exam due after 08/28/22.

## 2021-12-22 ENCOUNTER — Encounter: Payer: Self-pay | Admitting: Nurse Practitioner

## 2022-02-19 ENCOUNTER — Ambulatory Visit (INDEPENDENT_AMBULATORY_CARE_PROVIDER_SITE_OTHER): Payer: Commercial Managed Care - PPO | Admitting: Internal Medicine

## 2022-02-19 ENCOUNTER — Encounter: Payer: Self-pay | Admitting: Internal Medicine

## 2022-02-19 VITALS — BP 118/74 | HR 97 | Ht 66.0 in | Wt 262.0 lb

## 2022-02-19 DIAGNOSIS — E890 Postprocedural hypothyroidism: Secondary | ICD-10-CM | POA: Diagnosis not present

## 2022-02-19 LAB — TSH: TSH: 2.35 u[IU]/mL (ref 0.35–5.50)

## 2022-02-19 LAB — T4, FREE: Free T4: 1.06 ng/dL (ref 0.60–1.60)

## 2022-02-19 MED ORDER — SYNTHROID 200 MCG PO TABS: 200.0000 ug | ORAL_TABLET | Freq: Every day | ORAL | 3 refills | Status: DC

## 2022-02-19 NOTE — Patient Instructions (Signed)

## 2022-02-19 NOTE — Progress Notes (Signed)
Name: Kathryn Fleming  MRN/ DOB: [id number], Aug 12, 1990    Age/ Sex: 32 y.o., female     PCP: Charyl Dancer, NP   Reason for Endocrinology Evaluation: Hypothyrodism     Initial Endocrinology Clinic Visit: 10/27/2019    PATIENT IDENTIFIER: Kathryn Fleming is a 32 y.o., female with a past medical history of hypothyroidism. She has followed with [facility] since 10/27/2019 for consultative assistance with management of her hypothyroidism      HISTORICAL SUMMARY:  She is S/P thyroidectomy secondary to [place] in 2010 with benign pathology . Has been on LT- 4 replacement since then   She is a psych NP    On her initial visit to our clinic she was pregnant in first trimester   S/P delivery on 05/11/2020 - [place]:    Today (02/19/2022):  Kathryn Fleming is here for a follow up on hypothyroidism .  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 She has never used smokeless tobacco. She reports that she does not currently use alcohol. She reports that she does not use drugs. Family History:  Family History  Problem Relation Age of Onset   Multiple  sclerosis Mother    Hypertension Mother    Obesity Mother    Cancer Father        kidney   Heart disease Father    Diabetes Father    Atrial fibrillation Father    Obesity Father    Kidney cancer Father    Sleep apnea Father    Heart disease Maternal Grandmother    Heart disease Maternal Grandfather    Heart disease Paternal Grandmother    Cancer Paternal Grandfather        leukemia     HOME MEDICATIONS: Allergies as of 02/19/2022       Reactions   Levothyroxine Rash        Medication List        Accurate as of February 19, 2022 10:09 AM. If you have any questions, ask your nurse or doctor.          famotidine 20 MG tablet Commonly known as: PEPCID Take 20 mg by mouth daily as needed for heartburn or indigestion.   Synthroid 200 MCG tablet Generic drug: levothyroxine Take 1 tablet (200 mcg total) by mouth daily before breakfast.          OBJECTIVE:   PHYSICAL EXAM: VS: BP 118/74 (BP Location: Left Arm, Patient Position: Sitting, Cuff Size: Large)   Pulse 97   Ht 5\' 6"  (1.676 m)   Wt 262 lb (118.8  kg)   SpO2 99%   BMI 42.29 kg/m    EXAM: General: Pt appears well and is in NAD  Neck: General: Supple without adenopathy. Thyroid: No goiter or nodules appreciated.   Lungs: Clear with good BS bilat   Heart: Auscultation: RRR.  Abdomen: soft, nontender  Extremities:  BL LE: No pretibial edema normal ROM and strength.  Mental Status: Judgment, insight: Intact Memory: Intact for recent and remote events Mood and affect: No depression, anxiety, or agitation     DATA REVIEWED:  Latest Reference Range & Units 02/19/22 10:18  TSH 0.35 - 5.50 uIU/mL 2.35  T4,Free(Direct) 0.60 - 1.60 ng/dL 1.06     ASSESSMENT / PLAN / RECOMMENDATIONS:   Post- surgical hypothyroidism:  -Patient is clinically euthyroid - No local neck symptoms  -TSH is normal, no change    Medications  Continue Synthroid 200  daily    Recommend continuation of current Tx  with primary MD and consultative f/u at [facility] in the future if pt's thyroid  control becomes problematic or she becomes problematic    Signed electronically by: Mack Guise, MD  [facility] Endocrinology  [facility] [address] [place], [place] [address] Phone: [phone] FAX: [phone]      CC: Charyl Dancer, NP [place] [address] Phone: [phone]  Fax: [phone]   Return to Endocrinology clinic as below: Future Appointments  Date Time Provider [place]  02/19/2022 10:10 AM [place] Devine, Melanie Crazier, MD [facility] None  03/06/2022  9:20 AM [facility] NURSE [facility]

## 2022-03-06 ENCOUNTER — Ambulatory Visit: Payer: Self-pay

## 2022-06-12 ENCOUNTER — Ambulatory Visit (INDEPENDENT_AMBULATORY_CARE_PROVIDER_SITE_OTHER): Payer: Commercial Managed Care - PPO | Admitting: Nurse Practitioner

## 2022-06-12 ENCOUNTER — Encounter: Payer: Self-pay | Admitting: Nurse Practitioner

## 2022-06-12 ENCOUNTER — Telehealth: Payer: Self-pay | Admitting: Nurse Practitioner

## 2022-06-12 VITALS — BP 113/77 | HR 67 | Temp 97.5°F | Ht 66.0 in | Wt 263.0 lb

## 2022-06-12 DIAGNOSIS — E890 Postprocedural hypothyroidism: Secondary | ICD-10-CM

## 2022-06-12 DIAGNOSIS — R42 Dizziness and giddiness: Secondary | ICD-10-CM | POA: Diagnosis not present

## 2022-06-12 DIAGNOSIS — N912 Amenorrhea, unspecified: Secondary | ICD-10-CM | POA: Diagnosis not present

## 2022-06-12 DIAGNOSIS — R0609 Other forms of dyspnea: Secondary | ICD-10-CM

## 2022-06-12 LAB — POCT GLUCOSE (DEVICE FOR HOME USE)
Glucose Fasting, POC: 82 mg/dL (ref 70–99)
POC Glucose: 82 mg/dl (ref 70–99)

## 2022-06-12 NOTE — Progress Notes (Signed)
Acute Office Visit  Subjective:     Patient ID: Kathryn Fleming, female    DOB: Aug 17, 1990, 32 y.o.   MRN: [id number]  Chief Complaint  Patient presents with   Shortness of Breath    Fatigue, headache, bodyaches, light headed, right scapula pain      HPI Patient is in today for fatigue, body aches, and shortness of breath.   She states that her symptoms started with bodyaches yesterday.  Then today, she started feeling fatigued, light-headed and having right shoulder pain. She walked out to her car and noticed that she was short of breath and also sweating and a heaviness in her chest. She denies fevers and swelling in her legs. She states that she did go on a 5.5 hour flight to and from [place] 3 weeks ago. She denies nasal congestion, cough, and sore throat.   She also notes that she has not had a menstrual period since December. She did receive a depo provera injection in November, but did not have an injection since then. She has been having an increase in urinary frequency as well.   ROS See pertinent positives and negatives per HPI.     Objective:    BP 113/77 (BP Location: Right Arm)   Pulse 67   Temp (!) 97.5 F (36.4 C)   Ht 5\' 6"  (1.676 m)   Wt 263 lb (119.3 kg)   SpO2 98%   BMI 42.45 kg/m    Physical Exam Vitals and nursing note reviewed.  Constitutional:      General: She is not in acute distress.    Appearance: Normal appearance.  HENT:     Head: Normocephalic.  Eyes:     Conjunctiva/sclera: Conjunctivae normal.  Cardiovascular:     Rate and Rhythm: Normal rate and regular rhythm.     Pulses: Normal pulses.     Heart sounds: Normal heart sounds.  Pulmonary:     Effort: Pulmonary effort is normal.     Breath sounds: Normal breath sounds.  Abdominal:     Palpations: Abdomen is soft.     Tenderness: There is no abdominal tenderness. There is no guarding or rebound.  Musculoskeletal:     Cervical back: Normal range of motion and neck supple. No  tenderness.     Right lower leg: No edema.     Left lower leg: No edema.  Lymphadenopathy:     Cervical: No cervical adenopathy.  Skin:    General: Skin is warm.  Neurological:     General: No focal deficit present.     Mental Status: She is alert and oriented to person, place, and time.  Psychiatric:        Mood and Affect: Mood normal.        Behavior: Behavior normal.        Thought Content: Thought content normal.        Judgment: Judgment normal.     Results for orders placed or performed in visit on 06/12/22  D-Dimer, Quantitative  Result Value Ref Range   D-Dimer, Quant 0.23 <0.50 mcg/mL FEU  POCT Glucose (Device for Home Use)  Result Value Ref Range   Glucose Fasting, POC 82 70 - 99 mg/dL   POC Glucose 82 70 - 99 mg/dl        Assessment & Plan:   Problem List Items Addressed This Visit       Endocrine   Hypothyroidism    With current symptoms, will check thyroid panel  today. Adjust regimen based on results.       Relevant Orders   Thyroid Panel With TSH     Other   Dyspnea on exertion - Primary    She has been experiencing body aches, shortness of breath with exertion, and light-headedness for the last day. EKG shows normal sinus rhythm with no ST or T wave changes. Glucose 82. She did go on a recent flight to [place] (state) 3 weeks ago. Will check CMP, CBC, and d-dimer today. Discussed ER precautions. Encourage rest, fluids. Follow-up based on labs.       Relevant Orders   EKG 12-Lead (Completed)   Comprehensive metabolic panel   CBC   D-Dimer, Quantitative (Completed)   Lipase   Light headed    Will check CMP, CBC, thyroid panel, A1c today. Glucose was 82. EKG done and showed normal sinus rhythm with no ST or T wave changes. Encourage fluids and rest. Discussed ER precautions. Follow-up based on lab results.       Relevant Orders   POCT Glucose (Device for Home Use) (Completed)   Comprehensive metabolic panel   CBC   D-Dimer, Quantitative  (Completed)   Hemoglobin A1c   Lipase   Thyroid Panel With TSH   Other Visit Diagnoses     Amenorrhea       Will check beta hcg today.   Relevant Orders   Beta hCG quant (ref lab)       No orders of the defined types were placed in this encounter.   Return if symptoms worsen or fail to improve, for based on labs.  Gerre Scull, NP  A total of 30 minutes were spent on this encounter today. When total time is documented, this includes both the face-to-face and non-face-to-face time personally spent before, during and after the visit on the date of the encounter. I reviewed prior labs, EKG, discussed results, and differential diagnoses.

## 2022-06-12 NOTE — Assessment & Plan Note (Signed)
With current symptoms, will check thyroid panel today. Adjust regimen based on results.

## 2022-06-12 NOTE — Assessment & Plan Note (Signed)
She has been experiencing body aches, shortness of breath with exertion, and light-headedness for the last day. EKG shows normal sinus rhythm with no ST or T wave changes. Glucose 82. She did go on a recent flight to [place] (state) 3 weeks ago. Will check CMP, CBC, and d-dimer today. Discussed ER precautions. Encourage rest, fluids. Follow-up based on labs.

## 2022-06-12 NOTE — Assessment & Plan Note (Signed)
Will check CMP, CBC, thyroid panel, A1c today. Glucose was 82. EKG done and showed normal sinus rhythm with no ST or T wave changes. Encourage fluids and rest. Discussed ER precautions. Follow-up based on lab results.

## 2022-06-12 NOTE — Progress Notes (Signed)
EKG interpreted by me on 06/12/22 showed normal sinus rhythm with a heart rate of 75. No ST or T wave changes.

## 2022-06-12 NOTE — Telephone Encounter (Signed)
FYI:Pt called in wanting to schedule an appt with Lauren. She is sob, sweating with little exertion and body aches. I transferred her over to nurse triage.

## 2022-06-12 NOTE — Patient Instructions (Signed)
It was great to see you!  We are checking your labs today and will let you know the results via mychart/phone.   Your EKG looks normal.  Drink fluids.   If your symptoms get worse, or any concerns, go to the ER.   Let's follow-up based on labs  Take care,  Rodman Pickle, NP

## 2022-06-13 LAB — COMPREHENSIVE METABOLIC PANEL
AG Ratio: 1.6 (calc) (ref 1.0–2.5)
ALT: 33 U/L — ABNORMAL HIGH (ref 6–29)
AST: 17 U/L (ref 10–30)
Albumin: 4.4 g/dL (ref 3.6–5.1)
Alkaline phosphatase (APISO): 60 U/L (ref 31–125)
BUN: 13 mg/dL (ref 7–25)
CO2: 24 mmol/L (ref 20–32)
Calcium: 9.2 mg/dL (ref 8.6–10.2)
Chloride: 105 mmol/L (ref 98–110)
Creat: 0.96 mg/dL (ref 0.50–0.97)
Globulin: 2.8 g/dL (calc) (ref 1.9–3.7)
Glucose, Bld: 85 mg/dL (ref 65–99)
Potassium: 4.5 mmol/L (ref 3.5–5.3)
Sodium: 141 mmol/L (ref 135–146)
Total Bilirubin: 0.3 mg/dL (ref 0.2–1.2)
Total Protein: 7.2 g/dL (ref 6.1–8.1)

## 2022-06-13 LAB — BETA HCG QUANT (REF LAB): hCG Quant: <1 m[IU]/mL

## 2022-06-13 LAB — CBC
HCT: 43.4 % (ref 35.0–45.0)
Hemoglobin: 14.7 g/dL (ref 11.7–15.5)
MCH: 29.9 pg (ref 27.0–33.0)
MCHC: 33.9 g/dL (ref 32.0–36.0)
MCV: 88.2 fL (ref 80.0–100.0)
MPV: 10.1 fL (ref 7.5–12.5)
Platelets: 347 10*3/uL (ref 140–400)
RBC: 4.92 10*6/uL (ref 3.80–5.10)
RDW: 13.1 % (ref 11.0–15.0)
WBC: 7.7 10*3/uL (ref 3.8–10.8)

## 2022-06-13 LAB — THYROID PANEL WITH TSH
Free Thyroxine Index: 2.8 (ref 1.4–3.8)
T3 Uptake: 29 % (ref 22–35)
T4, Total: 9.7 ug/dL (ref 5.1–11.9)
TSH: 2.64 mIU/L

## 2022-06-13 LAB — D-DIMER, QUANTITATIVE: D-Dimer, Quant: 0.23 mcg/mL FEU (ref <0.50)

## 2022-06-13 LAB — HEMOGLOBIN A1C
Hgb A1c MFr Bld: 5.5 % of total Hgb (ref <5.7)
Mean Plasma Glucose: 111 mg/dL
eAG (mmol/L): 6.2 mmol/L

## 2022-06-13 LAB — LIPASE: Lipase: 21 U/L (ref 7–60)

## 2022-07-22 ENCOUNTER — Encounter: Payer: Self-pay | Admitting: Nurse Practitioner

## 2022-08-19 IMAGING — US US OB TRANSVAGINAL
1 series · 15 of 28 positions shown · non-contrast
Comparison: None

CLINICAL DATA: First trimester pregnancy, dating, LMP 08/24/2019

EXAM:
TRANSVAGINAL OB ULTRASOUND
TECHNIQUE: Transvaginal ultrasound was performed for complete evaluation of the
gestation as well as the maternal uterus, adnexal regions, and
pelvic cul-de-sac.

[Series 1: us ob transvaginal · 15 of 63 slices shown]
[im 1/63]
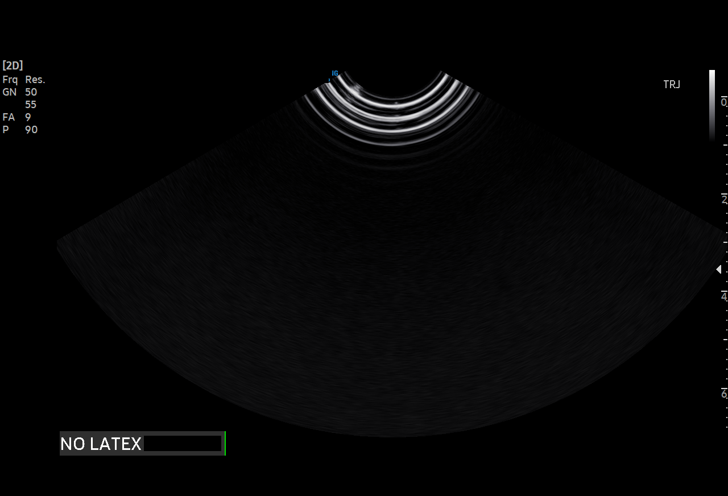
[im 5/63]
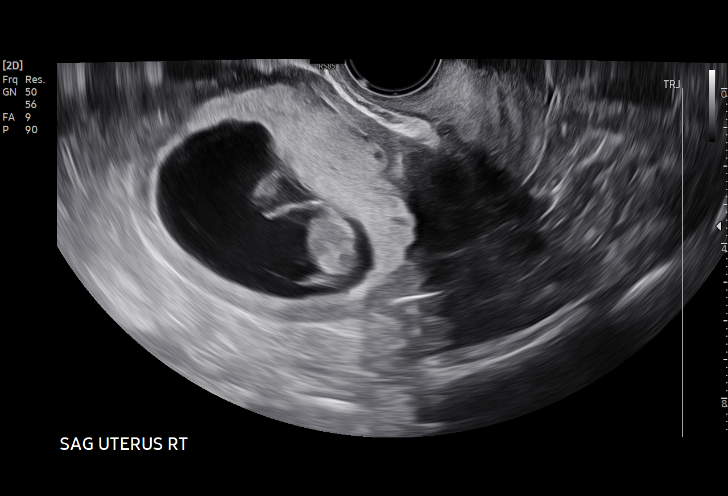
[im 10/63]
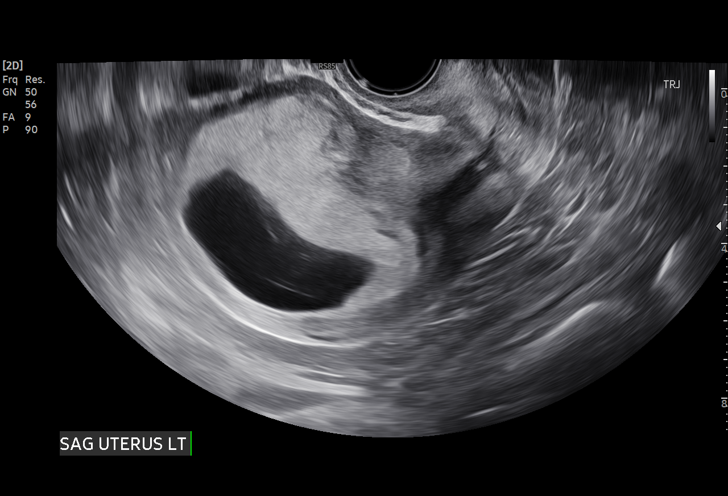
[im 14/63]
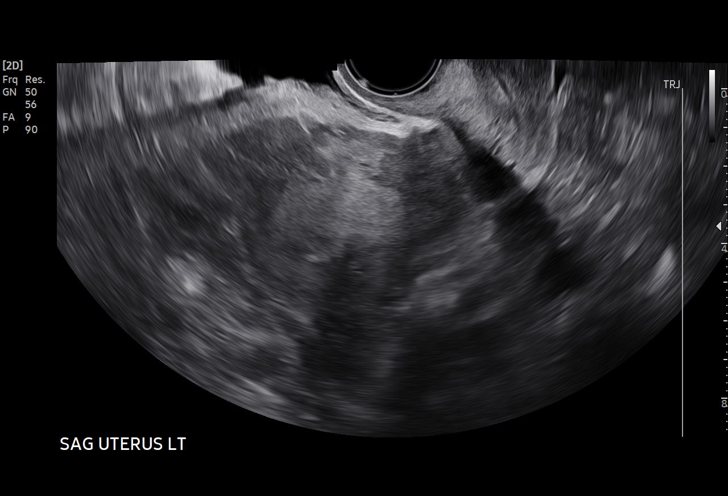
[im 19/63]
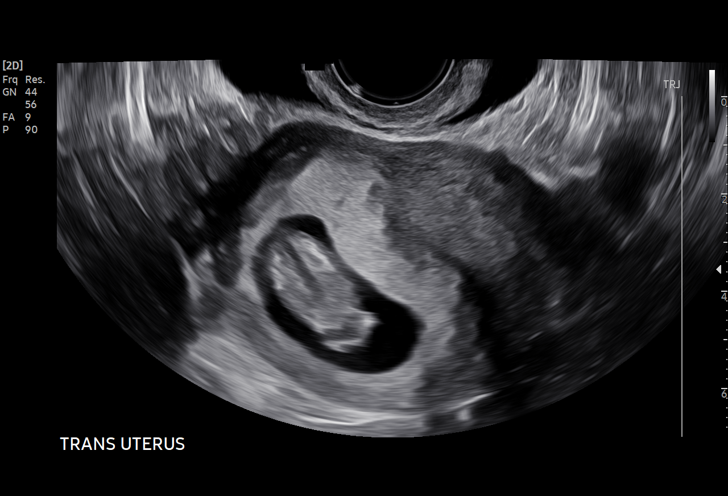
[im 23/63]
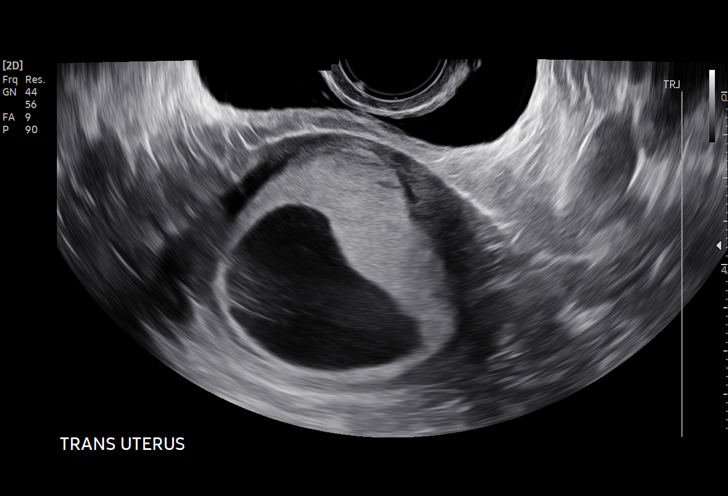
[im 28/63]
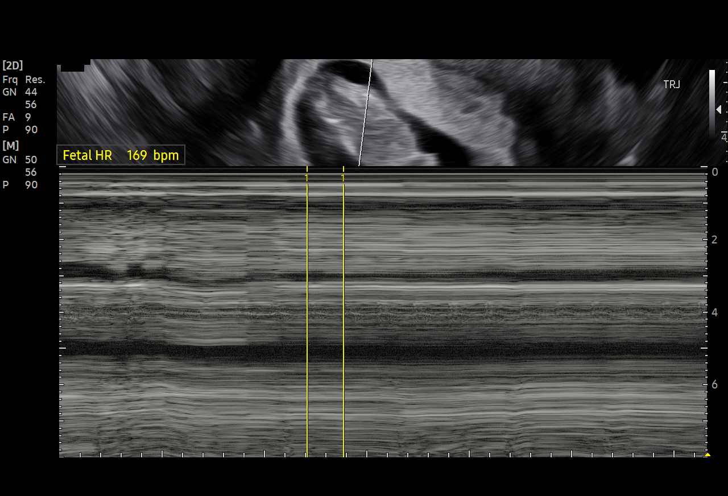
[im 33/63]
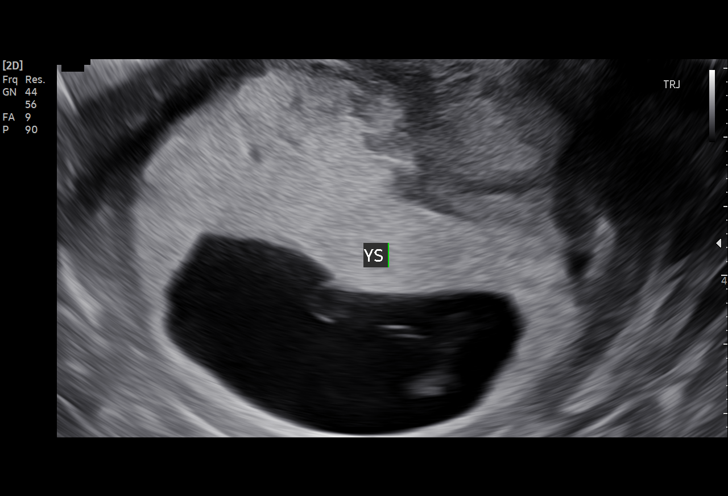
[im 35/63]
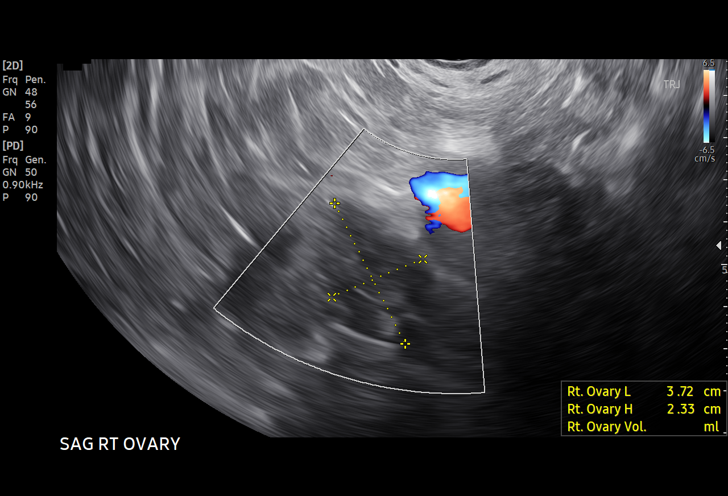
[im 40/63]
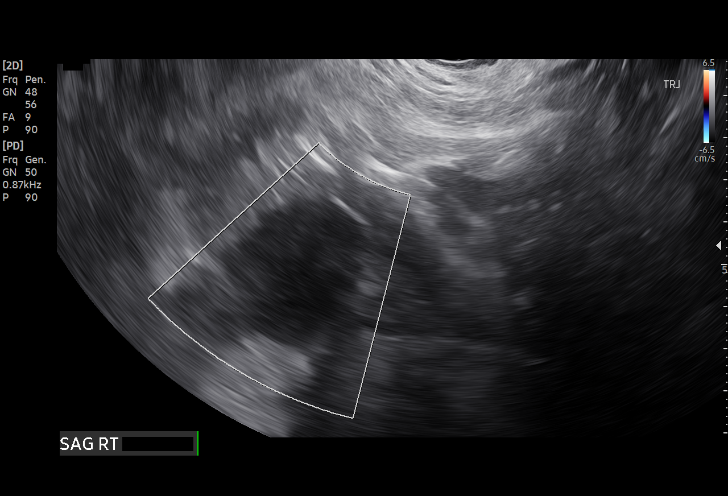
[im 44/63]
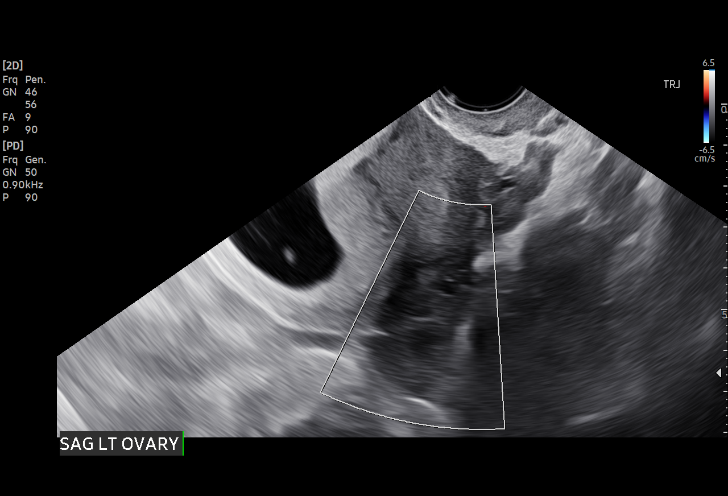
[im 49/63]
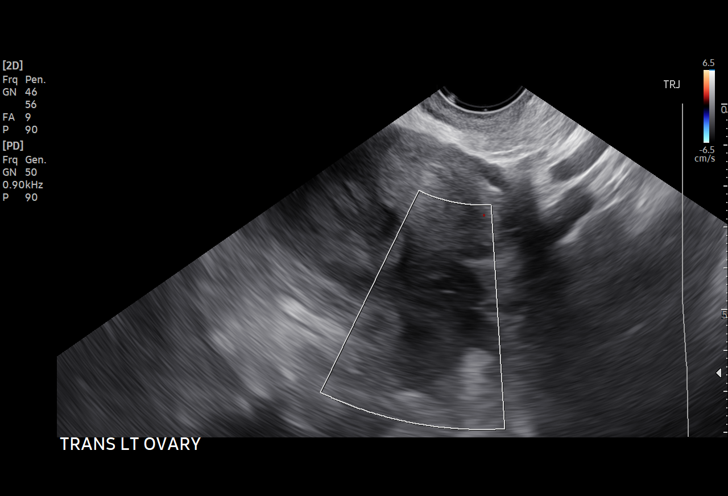
[im 53/63]
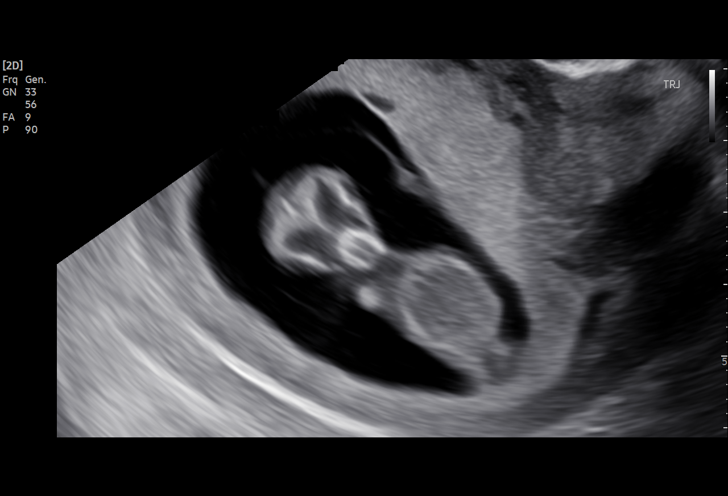
[im 58/63]
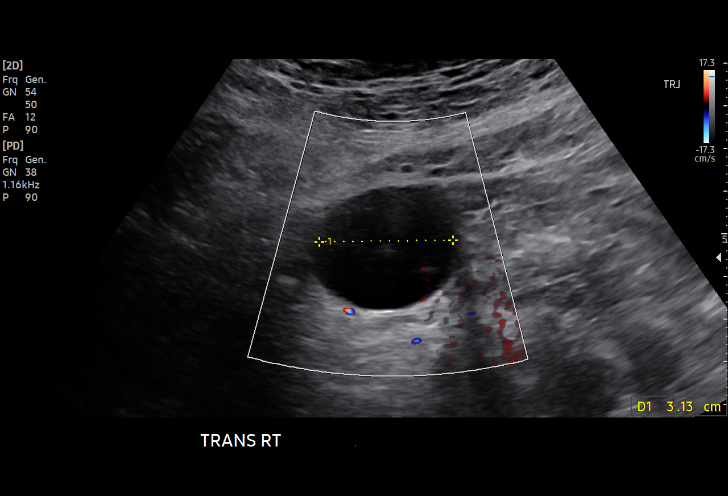
[im 63/63]
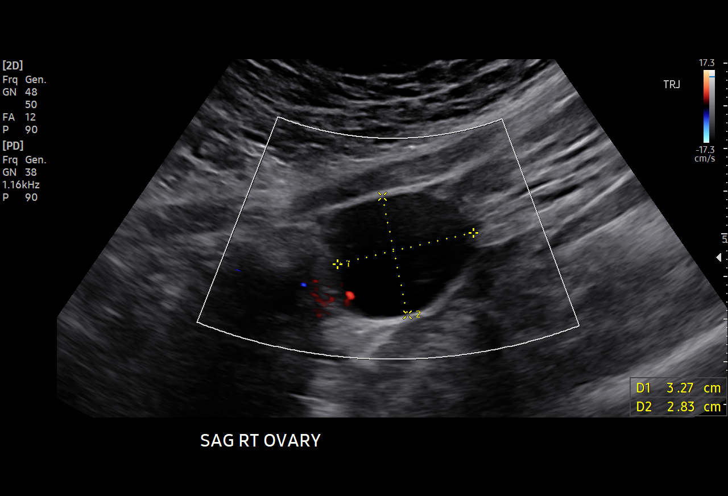

[15 of 28 positions shown; findings below may reference images not displayed]

FINDINGS: Intrauterine gestational sac: Present, single

Yolk sac:  Present

Embryo:  Present

Cardiac Activity: Present

Heart Rate: 169 bpm

CRL:   42.0 mm   11 w 1 d                  US EDC: 05/29/2019

Subchorionic hemorrhage:  None visualized.

Maternal uterus/adnexae:

Maternal uterus anteverted.

On sagittal imaging, visualized developing placenta appears low,
requiring assessment on follow-up to exclude placenta previa.

RIGHT ovary normal size and morphology, 3.7 x 2.3 x 2.0 cm.

LEFT ovary normal size and morphology, 3.8 x 1.9 x 2.4 cm.

RIGHT paraovarian cyst identified 3.5 x 3.0 x 2.5 cm in size.

No free pelvic fluid or additional pelvic masses.
IMPRESSION: Single live intrauterine gestation at 11 weeks 1 day EGA by
crown-rump length.

RIGHT paraovarian cyst 3.5 cm diameter.

Developing placenta appears low lying, recommend assessment on
follow-up imaging to exclude placenta previa.

## 2022-08-19 IMAGING — US US PELVIS LIMITED
1 series · 15 of 25 positions shown · non-contrast
Comparison: None; prior outside CT exam is not available for
comparison.

CLINICAL DATA: Prior abnormal outside CT exam demonstrating
presence of a 3.5 x 2.8 cm bladder mass

EXAM:
LIMITED ULTRASOUND OF PELVIS
TECHNIQUE: Limited transabdominal ultrasound examination of the pelvis was
performed.

[Series 1: us pelvis limited · 31 acquisitions, 15 frames shown]
[im 1/31]
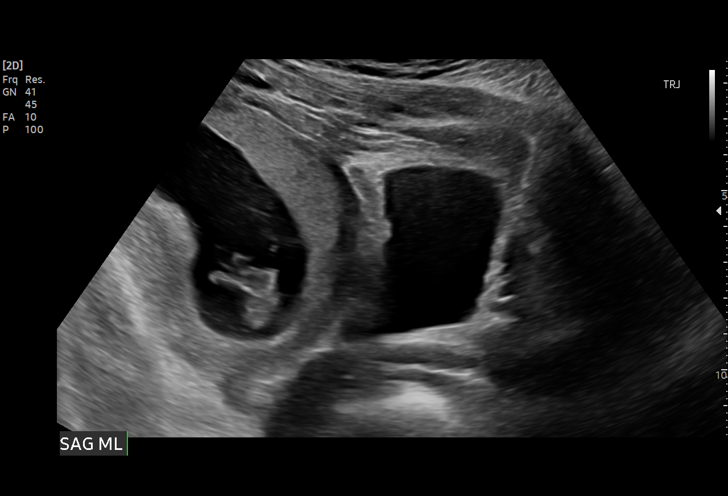
[im 3/31]
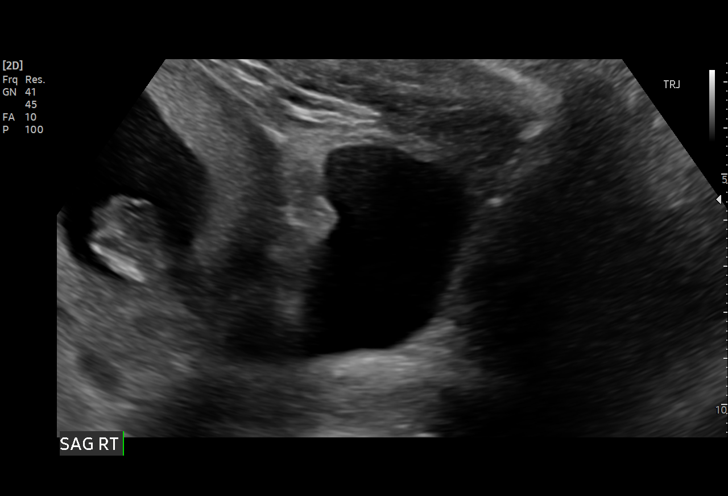
[im 6/31]
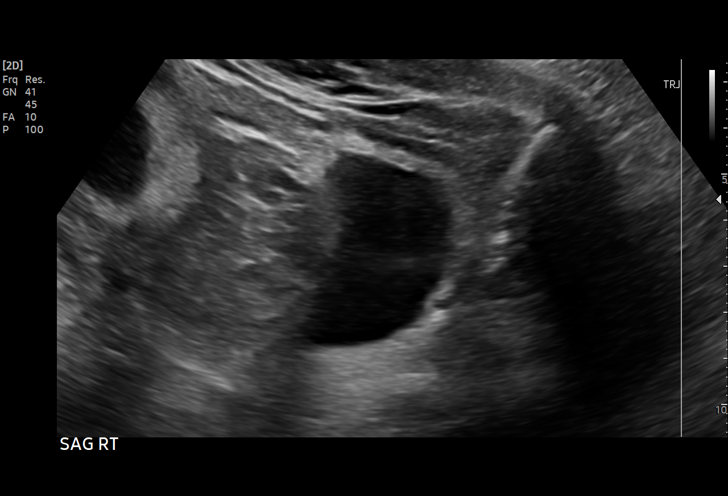
[im 7/31]
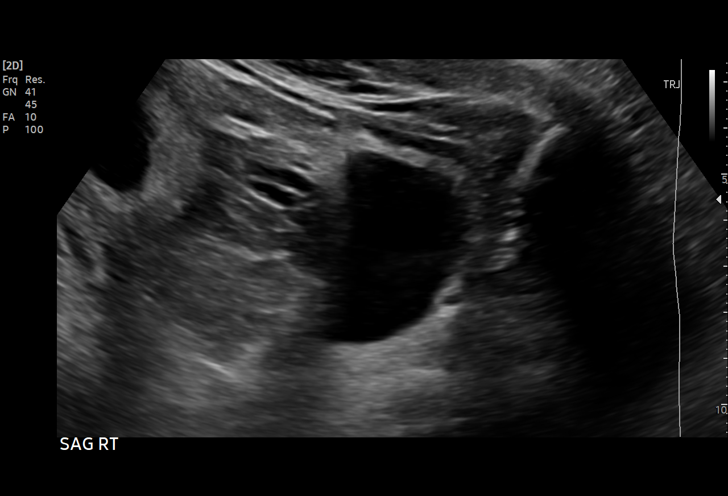
[im 9/31]
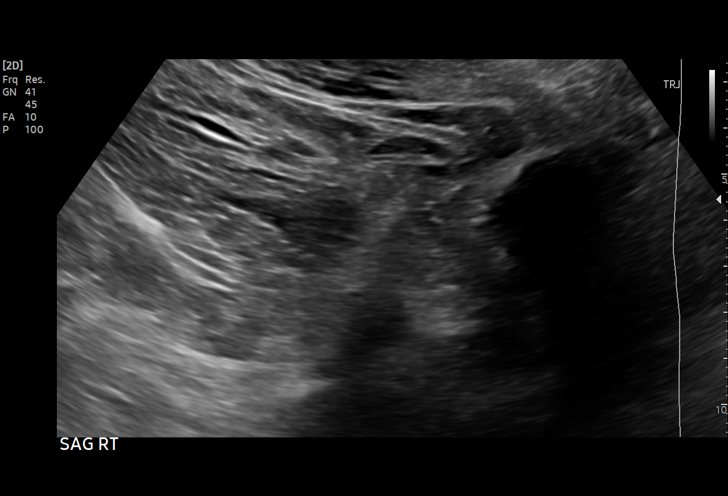
[im 12/31]
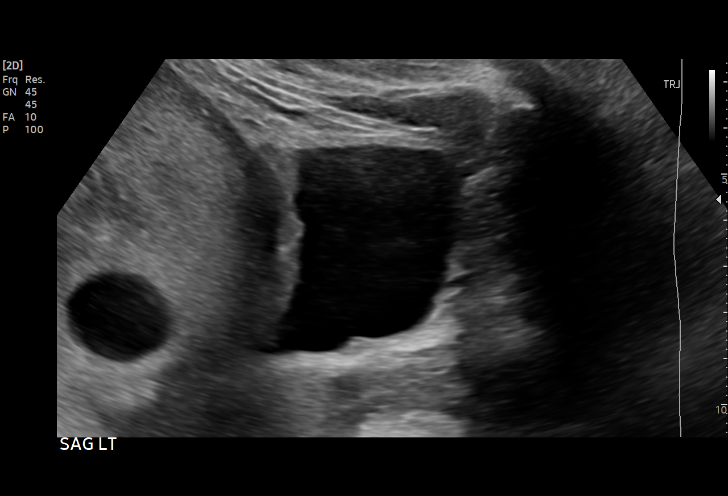
[im 13/31]
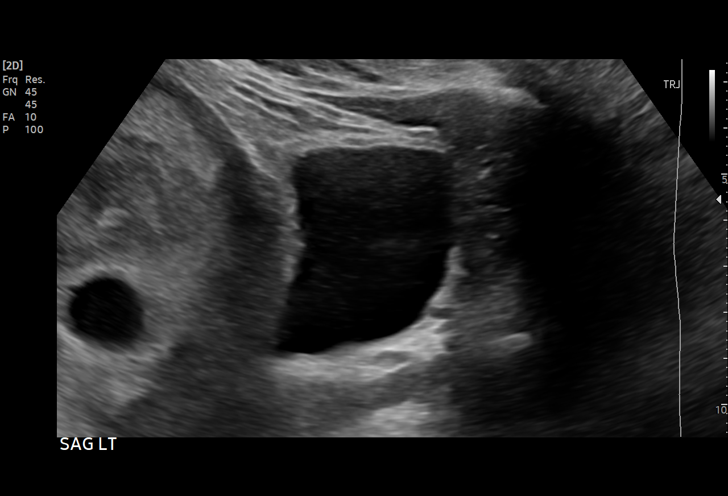
[im 16/31]
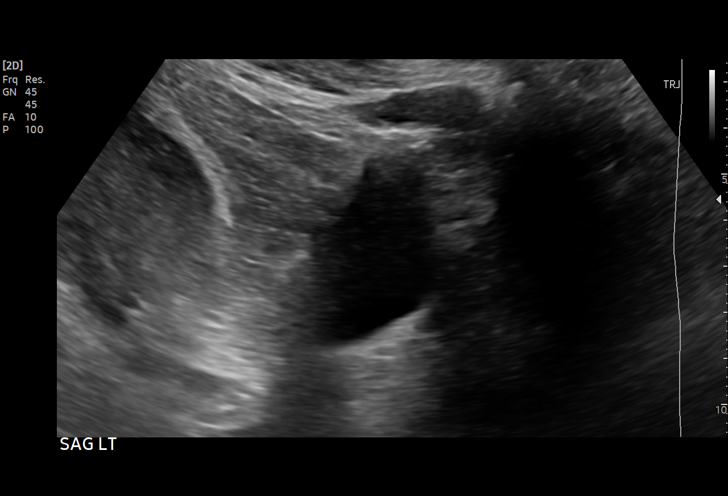
[im 18/31]
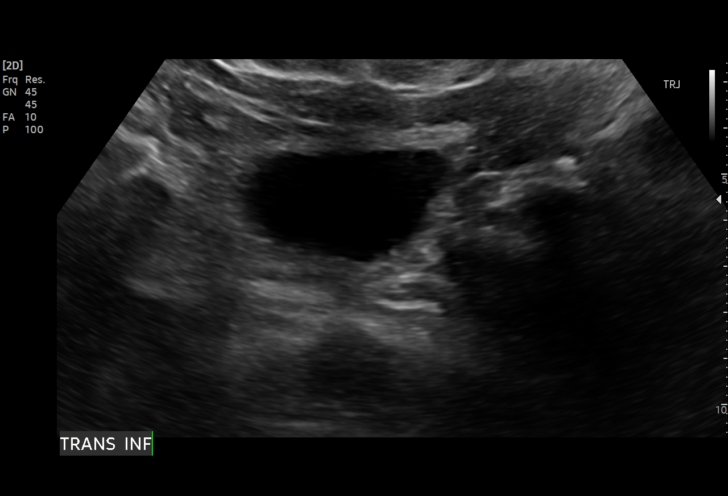
[im 19/31]
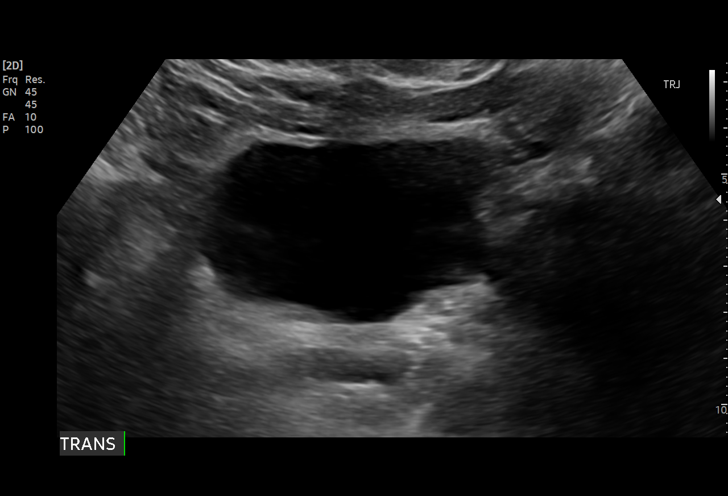
[im 22/31]
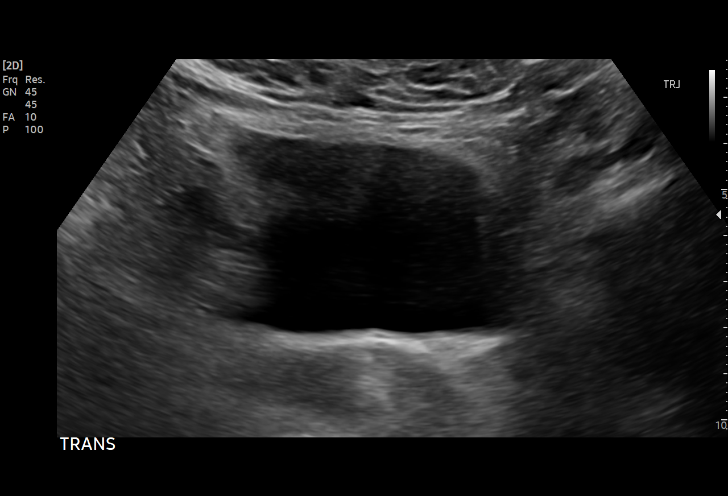
[im 24/31]
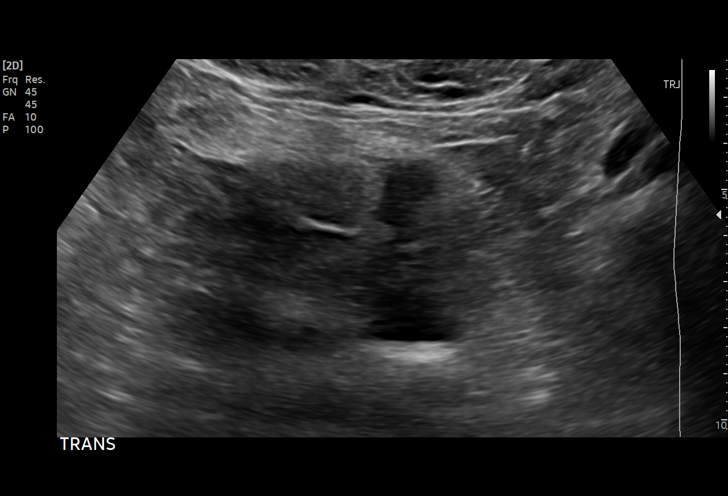
[im 26/31]
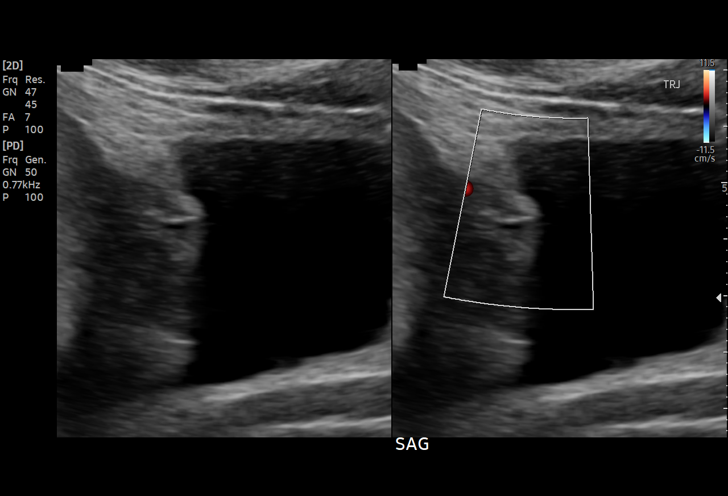
[im 28/31]
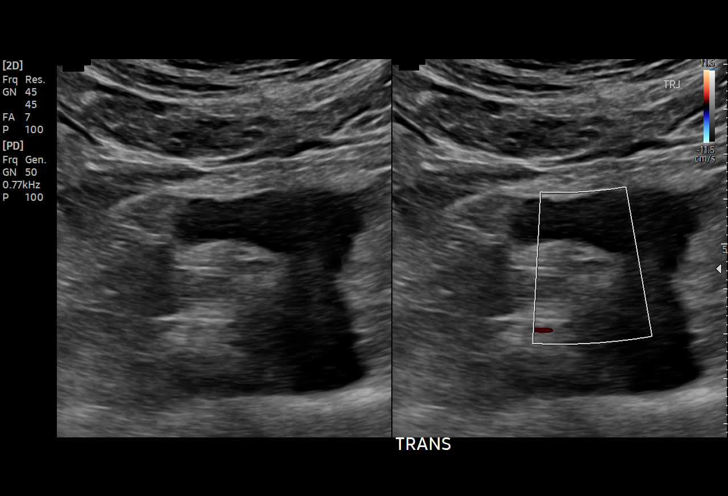
[im 31/31]
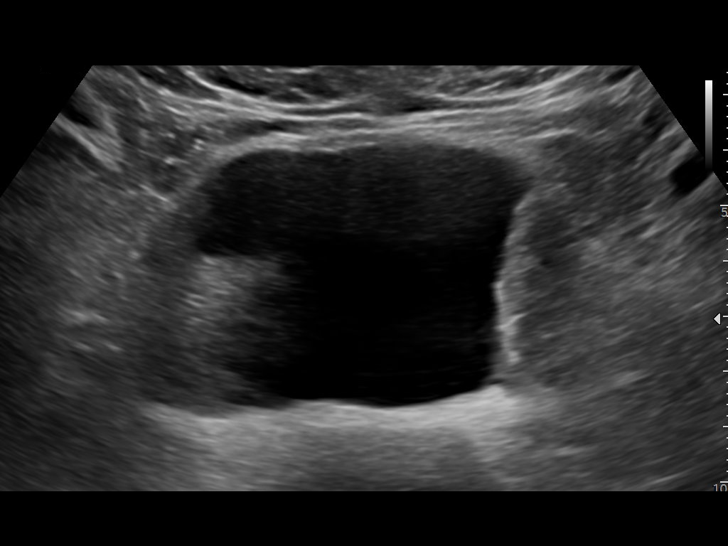

[15 of 25 positions shown; findings below may reference images not displayed]

FINDINGS: Bladder moderately well distended, slight mass effect upon superior
aspect by gravid uterus.

No definite intraluminal bladder mass identified.

12 mm greatest diameter area of slight wall irregularity is seen
superior margin the bladder, may reflect indentation by adjacent
structures, of uncertain significance.

No 3.5 cm diameter bladder mass is evident.
IMPRESSION: No definite bladder mass visualized.

12 mm diameter area of slight wall irregularity at the superior
margin of the bladder, may reflect indentation by adjacent
structures though a focus of bladder wall thickening/mural based
mass is not entirely excluded; recommend prior outside exam be
obtained for comparison.

In addition, recommend attention on follow-up imaging to determine
significance.

## 2023-02-15 ENCOUNTER — Encounter: Payer: Self-pay | Admitting: Nurse Practitioner

## 2023-02-17 ENCOUNTER — Encounter: Payer: Self-pay | Admitting: Nurse Practitioner

## 2023-02-17 ENCOUNTER — Ambulatory Visit (INDEPENDENT_AMBULATORY_CARE_PROVIDER_SITE_OTHER): Payer: Commercial Managed Care - PPO | Admitting: Nurse Practitioner

## 2023-02-17 DIAGNOSIS — Z6841 Body Mass Index (BMI) 40.0 and over, adult: Secondary | ICD-10-CM

## 2023-02-17 DIAGNOSIS — E890 Postprocedural hypothyroidism: Secondary | ICD-10-CM

## 2023-02-17 MED ORDER — TIRZEPATIDE-WEIGHT MANAGEMENT 2.5 MG/0.5ML ~~LOC~~ SOLN: 2.5000 mg | SUBCUTANEOUS | 0 refills | Status: DC

## 2023-02-17 NOTE — Patient Instructions (Addendum)
 It was great to see you!  Start zebpound 2.5mg  weekly (1 vial)   This will be mailed to your house   Keep focusing on nutrition and exercise   2.5mg  dose = $399 5mg  dose = $550 7.5mg  and higher = $650   Contrave = $99   Phentermine = ~$20  Let's follow-up in 3 months, sooner if you have concerns.  If a referral was placed today, you will be contacted for an appointment. Please note that routine referrals can sometimes take up to 3-4 weeks to process. Please call our office if you haven't heard anything after this time frame.  Take care,  Rheba Cedar, NP

## 2023-02-17 NOTE — Progress Notes (Signed)
 Established Patient Office Visit  Subjective   Patient ID: Kathryn Fleming, female    DOB: 08-23-1990  Age: 33 y.o. MRN: [id number]  Chief Complaint  Patient presents with   Weight Management    Discuss weight loss options    HPI  Discussed the use of AI scribe software for clinical note transcription with the patient, who gave verbal consent to proceed.  History of Present Illness   Kathryn Fleming, a young mother with a history of obesity since puberty and total thyroidectomy, presents with concerns about her weight. She reports that she has always been obese and has tried various weight loss methods, including Weight Watchers, without success. She leads an active lifestyle, getting almost 10,000 steps every day, and does not feel that she is far from the mark in terms of healthy living. However, she is concerned about her future health as she gets older and is considering weight loss injections. She has done some research on the injections and is aware of the potential side effects, including gastrointestinal distress. She is also concerned about the cost of the treatment as her insurance only covers one office visit per year.      ROS See pertinent positives and negatives per HPI.    Objective:     BP 118/78 (BP Location: Left Arm, Patient Position: Sitting, Cuff Size: Normal)   Pulse 79   Temp (!) 97.4 F (36.3 C)   Ht 5\' 6"  (1.676 m)   Wt 276 lb 3.2 oz (125.3 kg)   LMP 02/14/2023 (Exact Date)   SpO2 99%   Breastfeeding No Comment: Stopped on 02/03/2023  BMI 44.58 kg/m  BP Readings from Last 3 Encounters:  02/17/23 118/78  06/12/22 113/77  02/19/22 118/74   Wt Readings from Last 3 Encounters:  02/17/23 276 lb 3.2 oz (125.3 kg)  06/12/22 263 lb (119.3 kg)  02/19/22 262 lb (118.8 kg)      Physical Exam Vitals and nursing note reviewed.  Constitutional:      General: She is not in acute distress.    Appearance: Normal appearance.  HENT:     Head: Normocephalic.   Eyes:     Conjunctiva/sclera: Conjunctivae normal.  Cardiovascular:     Rate and Rhythm: Normal rate and regular rhythm.     Pulses: Normal pulses.     Heart sounds: Normal heart sounds.  Pulmonary:     Effort: Pulmonary effort is normal.     Breath sounds: Normal breath sounds.  Musculoskeletal:     Cervical back: Normal range of motion.  Skin:    General: Skin is warm.  Neurological:     General: No focal deficit present.     Mental Status: She is alert and oriented to person, place, and time.  Psychiatric:        Mood and Affect: Mood normal.        Behavior: Behavior normal.        Thought Content: Thought content normal.        Judgment: Judgment normal.      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 Currently on Synthroid  with normal thyroid  levels. Continue current Synthroid  dose. Monitor thyroid  levels as weight changes may necessitate dose adjustment.         Other   Morbid obesity (HCC) - Primary   BMI 44.5.  Longstanding obesity with a history of unsuccessful weight loss attempts. Discussed the risks and benefits of weight loss injections versus other options. She has chosen to proceed with Zepbound. Start Zepbound 2.5mg  weekly, with plans to increase to 5mg  after 4 weeks. Check in via MyChart after 4 weeks to assess tolerance and effectiveness. Plan for in-person follow-up in 3 months. Continue to focus on nutrition and exercise.       Relevant Medications   tirzepatide (ZEPBOUND) 2.5 MG/0.5ML injection vial    Return in about 3 months (around 05/18/2023) for CPE.    Kathryn Benjamin, NP

## 2023-02-17 NOTE — Assessment & Plan Note (Signed)
 BMI 44.5. Longstanding obesity with a history of unsuccessful weight loss attempts. Discussed the risks and benefits of weight loss injections versus other options. She has chosen to proceed with Zepbound. Start Zepbound 2.5mg  weekly, with plans to increase to 5mg  after 4 weeks. Check in via MyChart after 4 weeks to assess tolerance and effectiveness. Plan for in-person follow-up in 3 months. Continue to focus on nutrition and exercise.

## 2023-02-17 NOTE — Assessment & Plan Note (Signed)
 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 Currently on Synthroid  with normal thyroid  levels. Continue current Synthroid  dose. Monitor thyroid  levels as weight changes may necessitate dose adjustment.

## 2023-03-13 ENCOUNTER — Encounter: Payer: Self-pay | Admitting: Nurse Practitioner

## 2023-03-15 NOTE — Telephone Encounter (Signed)
 Requesting: tirzepatide (ZEPBOUND) 2.5 MG/0.5ML injection vial  Last Visit: 02/17/2023 Next Visit: 05/18/2023 Last Refill: 02/17/2023  Please Advise

## 2023-03-16 MED ORDER — ZEPBOUND 5 MG/0.5ML ~~LOC~~ SOLN: 5.0000 mg | SUBCUTANEOUS | 0 refills | Status: DC

## 2023-04-05 ENCOUNTER — Other Ambulatory Visit: Payer: Self-pay | Admitting: Nurse Practitioner

## 2023-04-28 ENCOUNTER — Encounter: Payer: Self-pay | Admitting: Nurse Practitioner

## 2023-04-28 ENCOUNTER — Other Ambulatory Visit: Payer: Self-pay | Admitting: Internal Medicine

## 2023-04-28 ENCOUNTER — Other Ambulatory Visit: Payer: Self-pay | Admitting: Nurse Practitioner

## 2023-04-28 MED ORDER — SYNTHROID 200 MCG PO TABS: 200.0000 ug | ORAL_TABLET | Freq: Every day | ORAL | 3 refills | Status: DC

## 2023-04-28 NOTE — Telephone Encounter (Signed)
 Refill request for  Synthroid LR  02/19/22, #90, 3 rf (Endo) LOV  02/17/23 FOV  05/18/23   Please review and advise. Thanks. Dm/cma

## 2023-04-29 NOTE — Telephone Encounter (Signed)
 Requesting: ZEPBOUND 5 MG/0.5ML SUBCUTANEOUS SOLUTION  Last Visit: 02/17/2023 Next Visit: 05/18/2023 Last Refill: 04/06/2023  Please Advise

## 2023-05-01 ENCOUNTER — Encounter: Payer: Self-pay | Admitting: Nurse Practitioner

## 2023-05-03 MED ORDER — TIRZEPATIDE-WEIGHT MANAGEMENT 7.5 MG/0.5ML ~~LOC~~ SOLN: 7.5000 mg | SUBCUTANEOUS | 0 refills | Status: DC

## 2023-05-18 ENCOUNTER — Encounter: Payer: Commercial Managed Care - PPO | Admitting: Nurse Practitioner

## 2023-05-20 ENCOUNTER — Other Ambulatory Visit: Payer: Self-pay | Admitting: Nurse Practitioner

## 2023-05-20 NOTE — Telephone Encounter (Signed)
 Requesting: ZEPBOUND 7.5 MG/0.5ML SUBCUTANEOUS SOLUTION  Last Visit: 02/17/2023 Next Visit: 06/02/2023 Last Refill: 05/03/2023  Please Advise

## 2023-06-02 ENCOUNTER — Encounter: Payer: Self-pay | Admitting: Nurse Practitioner

## 2023-06-02 ENCOUNTER — Ambulatory Visit: Admitting: Nurse Practitioner

## 2023-06-02 VITALS — BP 110/72 | HR 80 | Temp 97.9°F | Ht 66.0 in | Wt 251.0 lb

## 2023-06-02 DIAGNOSIS — Z0000 Encounter for general adult medical examination without abnormal findings: Secondary | ICD-10-CM | POA: Diagnosis not present

## 2023-06-02 DIAGNOSIS — E890 Postprocedural hypothyroidism: Secondary | ICD-10-CM

## 2023-06-02 DIAGNOSIS — Z6841 Body Mass Index (BMI) 40.0 and over, adult: Secondary | ICD-10-CM | POA: Diagnosis not present

## 2023-06-02 LAB — CBC WITH DIFFERENTIAL/PLATELET
Basophils Absolute: 0 10*3/uL (ref 0.0–0.1)
Basophils Relative: 0.2 % (ref 0.0–3.0)
Eosinophils Absolute: 0 10*3/uL (ref 0.0–0.7)
Eosinophils Relative: 0.3 % (ref 0.0–5.0)
HCT: 42.4 % (ref 36.0–46.0)
Hemoglobin: 14.1 g/dL (ref 12.0–15.0)
Lymphocytes Relative: 30.4 % (ref 12.0–46.0)
Lymphs Abs: 1.5 10*3/uL (ref 0.7–4.0)
MCHC: 33.3 g/dL (ref 30.0–36.0)
MCV: 89.8 fl (ref 78.0–100.0)
Monocytes Absolute: 0.4 10*3/uL (ref 0.1–1.0)
Monocytes Relative: 7.4 % (ref 3.0–12.0)
Neutro Abs: 3.1 10*3/uL (ref 1.4–7.7)
Neutrophils Relative %: 61.7 % (ref 43.0–77.0)
Platelets: 325 10*3/uL (ref 150.0–400.0)
RBC: 4.72 Mil/uL (ref 3.87–5.11)
RDW: 14.2 % (ref 11.5–15.5)
WBC: 5 10*3/uL (ref 4.0–10.5)

## 2023-06-02 LAB — COMPREHENSIVE METABOLIC PANEL WITH GFR
ALT: 38 U/L — ABNORMAL HIGH (ref 0–35)
AST: 20 U/L (ref 0–37)
Albumin: 4.2 g/dL (ref 3.5–5.2)
Alkaline Phosphatase: 53 U/L (ref 39–117)
BUN: 10 mg/dL (ref 6–23)
CO2: 27 meq/L (ref 19–32)
Calcium: 8.9 mg/dL (ref 8.4–10.5)
Chloride: 105 meq/L (ref 96–112)
Creatinine, Ser: 0.74 mg/dL (ref 0.40–1.20)
GFR: 104.96 mL/min (ref >60.00)
Glucose, Bld: 89 mg/dL (ref 70–99)
Potassium: 4.1 meq/L (ref 3.5–5.1)
Sodium: 138 meq/L (ref 135–145)
Total Bilirubin: 0.4 mg/dL (ref 0.2–1.2)
Total Protein: 6.8 g/dL (ref 6.0–8.3)

## 2023-06-02 LAB — LIPID PANEL
Cholesterol: 152 mg/dL (ref 0–200)
HDL: 41.4 mg/dL (ref >39.00)
LDL Cholesterol: 102 mg/dL — ABNORMAL HIGH (ref 0–99)
NonHDL: 111.05
Total CHOL/HDL Ratio: 4
Triglycerides: 43 mg/dL (ref 0.0–149.0)
VLDL: 8.6 mg/dL (ref 0.0–40.0)

## 2023-06-02 LAB — TSH: TSH: 2.3 u[IU]/mL (ref 0.35–5.50)

## 2023-06-02 LAB — T4, FREE: Free T4: 0.93 ng/dL (ref 0.60–1.60)

## 2023-06-02 NOTE — Assessment & Plan Note (Signed)
 She has lost 25 pounds since starting zepbound. She is having minimal side effects of occasional diarrhea. She has adjusted her diet, increased protein, and joined a step challenge to hold her accountable. Will continue zepbound 7.5mg  injection weekly. Follow-up in 3 months.

## 2023-06-02 NOTE — Assessment & Plan Note (Signed)
 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 Currently on Synthroid  with normal thyroid  levels. Continue Synthroid  200mcg dose. Monitor thyroid  levels as weight changes may necessitate dose adjustment. Check TSH, free T4 today.

## 2023-06-02 NOTE — Progress Notes (Signed)
 BP 110/72 (BP Location: Left Arm, Patient Position: Sitting, Cuff Size: Normal)   Pulse 80   Temp 97.9 F (36.6 C)   Ht 5\' 6"  (1.676 m)   Wt 251 lb (113.9 kg)   LMP 05/05/2023 (Exact Date)   SpO2 99%   BMI 40.51 kg/m    Subjective:    Patient ID: Kathryn Fleming, female    DOB: 1990/10/02, 33 y.o.   MRN: [id number]  CC: Chief Complaint  Patient presents with   Annual Exam    With fasting lab work, follow up with     HPI: Kathryn Fleming is a 33 y.o. female presenting on 06/02/2023 for comprehensive medical examination. Current medical complaints include:none  She currently lives with: spouse, daughter Menopausal Symptoms: no  Depression and Anxiety Screen done today and results listed below:     06/02/2023    9:19 AM 02/17/2023    1:52 PM 11/21/2021    4:05 PM 08/27/2021    5:14 PM 07/03/2020   11:49 AM  Depression screen PHQ 2/9  Decreased Interest 0 0 1 0 0  Down, Depressed, Hopeless 0 0 1 0 0  PHQ - 2 Score 0 0 2 0 0  Altered sleeping 0 1 1 0 0  Tired, decreased energy 0 0 0 0 0  Change in appetite 0 0 0 0 0  Feeling bad or failure about yourself  0 0 0 0 0  Trouble concentrating 0 0 0 0 0  Moving slowly or fidgety/restless 0 0 0 0 0  Suicidal thoughts 0 0 0 0 0  PHQ-9 Score 0 1 3 0 0  Difficult doing work/chores Not difficult at all Somewhat difficult Not difficult at all        06/02/2023    9:19 AM 02/17/2023    1:52 PM 11/21/2021    4:05 PM 08/27/2021    5:14 PM  GAD 7 : Generalized Anxiety Score  Nervous, Anxious, on Edge 0 1 0 0  Control/stop worrying 0 0 0 0  Worry too much - different things 0 1 0 0  Trouble relaxing 0 1 0 0  Restless 0 0 0 0  Easily annoyed or irritable 0 2 1 0  Afraid - awful might happen 0 1 0 0  Total GAD 7 Score 0 6 1 0  Anxiety Difficulty Not difficult at all Somewhat difficult Not difficult at all     The patient does not have a history of falls. I did not complete a risk assessment for falls. A plan of care for falls was  not documented.   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   ZEPBOUND 7.5 MG/0.5ML injection vial INJECT 0.5 ML (7.5 MG) UNDER THE SKIN  ONCE WEEKLY (0.5ML= 50 UNITS)   famotidine  (PEPCID ) 20 MG tablet Take 20 mg by mouth daily as needed for heartburn or indigestion. (Patient not taking: Reported on 06/02/2023)   No current facility-administered medications on file prior to visit.    Allergies:  Allergies  Allergen Reactions   Levothyroxine  Rash    Social History:  Social History   Socioeconomic History   Marital status: Married    Spouse name: Not on file   Number of children: 1   Years of education: Not on file   Highest education level: Not on file  Occupational History   Not on file  Tobacco Use   Smoking status: Never   Smokeless tobacco: Never  Vaping Use   Vaping status: Never Used  Substance and Sexual Activity   Alcohol use: Not Currently   Drug use: Never   Sexual activity: Yes    Birth control/protection: None  Other Topics Concern   Not on file  Social History Narrative   Not on file   Social Drivers of Health   Financial Resource Strain: Not on file  Food Insecurity: No Food Insecurity (07/03/2020)   Hunger Vital Sign    Worried About Running  Out of Food in the Last Year: Never true    Ran Out of Food in the Last Year: Never true  Transportation Needs: No Transportation Needs (07/03/2020)   PRAPARE - Administrator, Civil Service (Medical): No    Lack of Transportation (Non-Medical): No  Physical Activity: Not on file  Stress: Not on file  Social Connections: Not on file  Intimate Partner Violence: Not At Risk (10/20/2019)   Humiliation, Afraid, Rape, and Kick questionnaire    Fear of Current or Ex-Partner: No    Emotionally Abused: No    Physically Abused: No    Sexually Abused: No   Social History   Tobacco Use  Smoking Status Never  Smokeless Tobacco Never   Social History   Substance and Sexual Activity  Alcohol Use Not Currently    Family History:  Family History  Problem Relation Age of Onset   Multiple sclerosis Mother    Hypertension Mother    Obesity Mother    Cancer Father        kidney   Heart disease Father    Diabetes Father    Atrial fibrillation Father    Obesity Father    Kidney cancer Father    Sleep apnea Father    Heart disease Maternal Grandmother    Heart disease Maternal Grandfather    Heart disease Paternal Grandmother    Cancer Paternal Grandfather        leukemia    Past medical history, surgical history, medications, allergies, family history and social history reviewed with patient today and changes made to appropriate areas of the chart.   Review of Systems  Constitutional: Negative.   HENT: Negative.    Eyes: Negative.   Respiratory: Negative.    Cardiovascular: Negative.   Gastrointestinal:  Positive for diarrhea (intermittent). Negative for abdominal pain.  Genitourinary: Negative.   Musculoskeletal: Negative.   Skin: Negative.   Neurological: Negative.   Psychiatric/Behavioral: Negative.     All other ROS negative except what is listed above and in the HPI.      Objective:    BP 110/72 (BP Location: Left Arm, Patient Position: Sitting, Cuff Size:  Normal)   Pulse 80   Temp 97.9 F (36.6 C)   Ht 5\' 6"  (  1.676 m)   Wt 251 lb (113.9 kg)   LMP 05/05/2023 (Exact Date)   SpO2 99%   BMI 40.51 kg/m   Wt Readings from Last 3 Encounters:  06/02/23 251 lb (113.9 kg)  02/17/23 276 lb 3.2 oz (125.3 kg)  06/12/22 263 lb (119.3 kg)    Physical Exam Vitals and nursing note reviewed.  Constitutional:      General: She is not in acute distress.    Appearance: Normal appearance.  HENT:     Head: Normocephalic and atraumatic.     Right Ear: Tympanic membrane, ear canal and external ear normal.     Left Ear: Tympanic membrane, ear canal and external ear normal.     Mouth/Throat:     Mouth: Mucous membranes are moist.     Pharynx: No posterior oropharyngeal erythema.  Eyes:     Conjunctiva/sclera: Conjunctivae normal.  Cardiovascular:     Rate and Rhythm: Normal rate and regular rhythm.     Pulses: Normal pulses.     Heart sounds: Normal heart sounds.  Pulmonary:     Effort: Pulmonary effort is normal.     Breath sounds: Normal breath sounds.  Abdominal:     Palpations: Abdomen is soft.     Tenderness: There is no abdominal tenderness.  Musculoskeletal:        General: Normal range of motion.     Cervical back: Normal range of motion and neck supple.     Right lower leg: No edema.     Left lower leg: No edema.  Lymphadenopathy:     Cervical: No cervical adenopathy.  Skin:    General: Skin is warm and dry.  Neurological:     General: No focal deficit present.     Mental Status: She is alert and oriented to person, place, and time.     Cranial Nerves: No cranial nerve deficit.     Coordination: Coordination normal.     Gait: Gait normal.  Psychiatric:        Mood and Affect: Mood normal.        Behavior: Behavior normal.        Thought Content: Thought content normal.        Judgment: Judgment normal.     Results for orders placed or performed in visit on 06/12/22  POCT Glucose (Device for Home Use)   Collection Time:  06/12/22  4:23 PM  Result Value Ref Range   Glucose Fasting, POC 82 70 - 99 mg/dL   POC Glucose 82 70 - 99 mg/dl  Comprehensive metabolic panel   Collection Time: 06/12/22  5:01 PM  Result Value Ref Range   Glucose, Bld 85 65 - 99 mg/dL   BUN 13 7 - 25 mg/dL   Creat 1.61 0.96 - 0.45 mg/dL   BUN/Creatinine Ratio SEE NOTE: 6 - 22 (calc)   Sodium 141 135 - 146 mmol/L   Potassium 4.5 3.5 - 5.3 mmol/L   Chloride 105 98 - 110 mmol/L   CO2 24 20 - 32 mmol/L   Calcium 9.2 8.6 - 10.2 mg/dL   Total Protein 7.2 6.1 - 8.1 g/dL   Albumin 4.4 3.6 - 5.1 g/dL   Globulin 2.8 1.9 - 3.7 g/dL (calc)   AG Ratio 1.6 1.0 - 2.5 (calc)   Total Bilirubin 0.3 0.2 - 1.2 mg/dL   Alkaline phosphatase (APISO) 60 31 - 125 U/L   AST 17 10 - 30 U/L   ALT 33 (H) 6 -  29 U/L  CBC   Collection Time: 06/12/22  5:01 PM  Result Value Ref Range   WBC 7.7 3.8 - 10.8 Thousand/uL   RBC 4.92 3.80 - 5.10 Million/uL   Hemoglobin 14.7 11.7 - 15.5 g/dL   HCT 13.0 86.5 - 78.4 %   MCV 88.2 80.0 - 100.0 fL   MCH 29.9 27.0 - 33.0 pg   MCHC 33.9 32.0 - 36.0 g/dL   RDW 69.6 29.5 - 28.4 %   Platelets 347 140 - 400 Thousand/uL   MPV 10.1 7.5 - 12.5 fL  Beta hCG quant (ref lab)   Collection Time: 06/12/22  5:01 PM  Result Value Ref Range   hCG Quant <1 mIU/mL  D-Dimer, Quantitative   Collection Time: 06/12/22  5:01 PM  Result Value Ref Range   D-Dimer, Quant 0.23 <0.50 mcg/mL FEU  Hemoglobin A1c   Collection Time: 06/12/22  5:01 PM  Result Value Ref Range   Hgb A1c MFr Bld 5.5 <5.7 % of total Hgb   Mean Plasma Glucose 111 mg/dL   eAG (mmol/L) 6.2 mmol/L  Lipase   Collection Time: 06/12/22  5:01 PM  Result Value Ref Range   Lipase 21 7 - 60 U/L  Thyroid  Panel With TSH   Collection Time: 06/12/22  5:01 PM  Result Value Ref Range   T3 Uptake 29 22 - 35 %   T4, Total 9.7 5.1 - 11.9 mcg/dL   Free Thyroxine Index 2.8 1.4 - 3.8   TSH 2.64 mIU/L      Assessment & Plan:   Problem List Items Addressed This Visit        Endocrine   Hypothyroidism   Status post total thyroidectomy in 2010 for nodular thyroid  disease. Currently on Synthroid  with normal thyroid  levels. Continue Synthroid  200mcg dose. Monitor thyroid  levels as weight changes may necessitate dose adjustment. Check TSH, free T4 today.         Other   Morbid obesity (HCC)   She has lost 25 pounds since starting zepbound. She is having minimal side effects of occasional diarrhea. She has adjusted her diet, increased protein, and joined a step challenge to hold her accountable. Will continue zepbound 7.5mg  injection weekly. Follow-up in 3 months.       Relevant Orders   Lipid panel   TSH   T4, free   Other Visit Diagnoses       Routine general medical examination at a health care facility    -  Primary   Relevant Orders   CBC with Differential/Platelet   Comprehensive metabolic panel with GFR        Follow up plan: Return in about 3 months (around 09/01/2023) for weight management .   LABORATORY TESTING:  - Pap smear: up to date  IMMUNIZATIONS:   - Tdap: Tetanus vaccination status reviewed: last tetanus booster within 10 years. - Influenza: Postponed to flu season - Pneumovax: Not applicable - Prevnar: Not applicable - HPV: Not applicable - Shingrix vaccine: Not applicable  SCREENING: -Mammogram: Not applicable  - Colonoscopy: Not applicable  - Bone Density: Not applicable   PATIENT COUNSELING:   Advised to take 1 mg of folate supplement per day if capable of pregnancy.   Sexuality: Discussed sexually transmitted diseases, partner selection, use of condoms, avoidance of unintended pregnancy  and contraceptive alternatives.   Advised to avoid cigarette smoking.  I discussed with the patient that most people either abstain from alcohol or drink within safe limits (<=14/week and <=4  drinks/occasion for males, <=7/weeks and <= 3 drinks/occasion for females) and that the risk for alcohol disorders and other health effects  rises proportionally with the number of drinks per week and how often a drinker exceeds daily limits.  Discussed cessation/primary prevention of drug use and availability of treatment for abuse.   Diet: Encouraged to adjust caloric intake to maintain  or achieve ideal body weight, to reduce intake of dietary saturated fat and total fat, to limit sodium intake by avoiding high sodium foods and not adding table salt, and to maintain adequate dietary potassium and calcium preferably from fresh fruits, vegetables, and low-fat dairy products.    stressed the importance of regular exercise  Injury prevention: Discussed safety belts, safety helmets, smoke detector, smoking near bedding or upholstery.   Dental health: Discussed importance of regular tooth brushing, flossing, and dental visits.    NEXT PREVENTATIVE PHYSICAL DUE IN 1 YEAR. Return in about 3 months (around 09/01/2023) for weight management .  Briea Mcenery A Aryan Bello

## 2023-06-02 NOTE — Patient Instructions (Signed)
It was great to see you!  We are checking your labs today and will let you know the results via mychart/phone.   Keep up the great work!   Let's follow-up in 3 months, sooner if you have concerns.  If a referral was placed today, you will be contacted for an appointment. Please note that routine referrals can sometimes take up to 3-4 weeks to process. Please call our office if you haven't heard anything after this time frame.  Take care,  Rodman Pickle, NP

## 2023-06-14 ENCOUNTER — Other Ambulatory Visit: Payer: Self-pay | Admitting: Nurse Practitioner

## 2023-06-14 NOTE — Telephone Encounter (Signed)
 Requesting: ZEPBOUND 7.5 MG/0.5ML SUBCUTANEOUS SOLUTION  Last Visit: 06/02/2023 Next Visit: 09/01/2023 Last Refill: 05/20/2023  Please Advise

## 2023-07-08 ENCOUNTER — Other Ambulatory Visit: Payer: Self-pay | Admitting: Nurse Practitioner

## 2023-07-09 NOTE — Telephone Encounter (Signed)
 Requesting: ZEPBOUND 7.5 MG/0.5ML SUBCUTANEOUS SOLUTION  Last Visit: 06/02/2023 Next Visit: 09/01/2023 Last Refill: 06/14/2023  Please Advise

## 2023-07-29 ENCOUNTER — Other Ambulatory Visit: Payer: Self-pay | Admitting: Nurse Practitioner

## 2023-07-29 NOTE — Telephone Encounter (Signed)
 Requesting: ZEPBOUND  7.5 MG/0.5ML SUBCUTANEOUS SOLUTION  Last Visit: 06/02/2023 Next Visit: 09/01/2023 Last Refill: 07/09/2023  Please Advise

## 2023-08-24 ENCOUNTER — Encounter: Payer: Self-pay | Admitting: Nurse Practitioner

## 2023-08-25 MED ORDER — TIRZEPATIDE-WEIGHT MANAGEMENT 10 MG/0.5ML ~~LOC~~ SOLN
10.0000 mg | SUBCUTANEOUS | 0 refills | Status: DC
Start: 2023-08-25 — End: 2023-11-15

## 2023-09-01 ENCOUNTER — Ambulatory Visit (INDEPENDENT_AMBULATORY_CARE_PROVIDER_SITE_OTHER): Admitting: Nurse Practitioner

## 2023-09-01 ENCOUNTER — Encounter: Payer: Self-pay | Admitting: Nurse Practitioner

## 2023-09-01 VITALS — BP 108/74 | HR 87 | Temp 97.5°F | Ht 66.0 in | Wt 245.8 lb

## 2023-09-01 DIAGNOSIS — E669 Obesity, unspecified: Secondary | ICD-10-CM | POA: Insufficient documentation

## 2023-09-01 DIAGNOSIS — L659 Nonscarring hair loss, unspecified: Secondary | ICD-10-CM

## 2023-09-01 NOTE — Assessment & Plan Note (Signed)
 BMI 39.6. Her weight has plateaued since April. She is participating in a walking competition and is mindful of her diet, though she needs to increase vegetable intake. Currently on Zepbound , she has not yet increased the dose to 10 mg. Plateaus can last months, so she is encouraged to track caloric intake to prevent under-eating. The plan is to increase Zepbound  to 10 mg, track caloric intake for 1-2 days to ensure at least 1200 calories per day, continue focusing on protein and fruit intake, increase vegetable consumption, and maintain participation in the walking competition.

## 2023-09-01 NOTE — Progress Notes (Signed)
 Established Patient Office Visit  Subjective   Patient ID: Kathryn Fleming, female    DOB: [birth year]  Age: 33 y.o. MRN: [id number]  Chief Complaint  Patient presents with   Weight Management    Follow up, concerns with hair loss    HPI  Discussed the use of AI scribe software for clinical note transcription with the patient, who gave verbal consent to proceed.  History of Present Illness   Kathryn Fleming is a 33 year old female who presents for weight management and concerns about hair loss.  She has maintained a weight in the 240s since April, despite participating in a walking competition and making dietary adjustments. A temporary increase to 260 pounds occurred after prednisone use for poison ivy, but her weight returned to the 240s after stopping the medication. Her diet includes [place] meals with rice, curry vegetables, shrimp or chicken, and eggs. She consumes coffee with collagen protein and premier protein coffee, typically eating two meals a day due to reduced hunger from her medication. Snacks include yogurt smoothies, string cheese, and fruits. She is mindful of rice portions and aims to increase vegetable intake.   She experiences hair loss, particularly during showers, with hair coming out in clumps. She uses collagen protein and has started prenatal vitamins. Her thyroid  was recently checked.  She experiences occasional diarrhea, especially after consuming unfamiliar or greasy foods, which disrupts her sleep. Her sleep quality is generally good, though previously affected by steroid use and travel. Her in-laws' presence has allowed for more rest recently.       ROS See pertinent positives and negatives per HPI.    Objective:     BP 108/74 (BP Location: Right Arm, Patient Position: Sitting, Cuff Size: Large)   Pulse 87   Temp (!) 97.5 F (36.4 C)   Ht 5' 6 (1.676 m)   Wt 245 lb 12.8 oz (111.5 kg)   LMP 08/19/2023 (Exact Date)   SpO2 98%   BMI 39.67  kg/m  BP Readings from Last 3 Encounters:  09/01/23 108/74  06/02/23 110/72  02/17/23 118/78   Wt Readings from Last 3 Encounters:  09/01/23 245 lb 12.8 oz (111.5 kg)  06/02/23 251 lb (113.9 kg)  02/17/23 276 lb 3.2 oz (125.3 kg)      Physical Exam Vitals and nursing note reviewed.  Constitutional:      General: She is not in acute distress.    Appearance: Normal appearance.  HENT:     Head: Normocephalic.  Eyes:     Conjunctiva/sclera: Conjunctivae normal.  Cardiovascular:     Rate and Rhythm: Normal rate and regular rhythm.     Pulses: Normal pulses.     Heart sounds: Normal heart sounds.  Pulmonary:     Effort: Pulmonary effort is normal.     Breath sounds: Normal breath sounds.  Musculoskeletal:     Cervical back: Normal range of motion.  Skin:    General: Skin is warm.  Neurological:     General: No focal deficit present.     Mental Status: She is alert and oriented to person, place, and time.  Psychiatric:        Mood and Affect: Mood normal.        Behavior: Behavior normal.        Thought Content: Thought content normal.        Judgment: Judgment normal.      Assessment & Plan:   Problem List Items Addressed This Visit  Other   Obesity (BMI 30-39.9)   BMI 39.6. Her weight has plateaued since April. She is participating in a walking competition and is mindful of her diet, though she needs to increase vegetable intake. Currently on Zepbound , she has not yet increased the dose to 10 mg. Plateaus can last months, so she is encouraged to track caloric intake to prevent under-eating. The plan is to increase Zepbound  to 10 mg, track caloric intake for 1-2 days to ensure at least 1200 calories per day, continue focusing on protein and fruit intake, increase vegetable consumption, and maintain participation in the walking competition.      Other Visit Diagnoses       Hair loss    -  Primary   Most likely side effect of zepbound  and weight loss. Cont  adequate protein, collagen, and start prenatal vitamin. Last TSH normal. Declines other labs today.      Return in about 3 months (around 12/02/2023) for weight management .    Tinnie DELENA Harada, NP

## 2023-09-01 NOTE — Patient Instructions (Signed)
 It was great to see you!  Start prenatal vitamin to help with hair loss   Increase zepbound  to 10mg  weekly   Track your calories for 1-2 days   Let's follow-up in 3 months, sooner if you have concerns.  If a referral was placed today, you will be contacted for an appointment. Please note that routine referrals can sometimes take up to 3-4 weeks to process. Please call our office if you haven't heard anything after this time frame.  Take care,  Tinnie Harada, NP

## 2023-11-01 ENCOUNTER — Other Ambulatory Visit: Payer: Self-pay | Admitting: Nurse Practitioner

## 2023-11-02 NOTE — Telephone Encounter (Signed)
 Requesting: ZEPBOUND  7.5 MG/0.5ML SUBCUTANEOUS SOLUTION  Last Visit: 09/01/2023 Next Visit: 12/02/2023 Last Refill: The original prescription was discontinued on 08/25/2023 by Nedra Tinnie LABOR, NP. Renewing this prescription may not be appropriate.   Please Advise

## 2023-11-13 ENCOUNTER — Encounter: Payer: Self-pay | Admitting: Nurse Practitioner

## 2023-11-15 MED ORDER — TIRZEPATIDE-WEIGHT MANAGEMENT 10 MG/0.5ML ~~LOC~~ SOLN: 10.0000 mg | SUBCUTANEOUS | 0 refills | Status: DC

## 2023-11-15 NOTE — Telephone Encounter (Signed)
 Requesting: tirzepatide  10 MG/0.5ML injection vial  Last Visit: 09/01/2023 Next Visit: 12/02/2023 Last Refill: 08/25/2023  Please Advise

## 2023-12-02 ENCOUNTER — Ambulatory Visit (INDEPENDENT_AMBULATORY_CARE_PROVIDER_SITE_OTHER): Admitting: Nurse Practitioner

## 2023-12-02 ENCOUNTER — Encounter: Payer: Self-pay | Admitting: Nurse Practitioner

## 2023-12-02 ENCOUNTER — Telehealth: Payer: Self-pay

## 2023-12-02 ENCOUNTER — Ambulatory Visit: Admitting: Nurse Practitioner

## 2023-12-02 ENCOUNTER — Other Ambulatory Visit (HOSPITAL_COMMUNITY): Payer: Self-pay

## 2023-12-02 VITALS — BP 110/78 | HR 85 | Temp 97.0°F | Ht 66.0 in | Wt 238.0 lb

## 2023-12-02 DIAGNOSIS — E669 Obesity, unspecified: Secondary | ICD-10-CM

## 2023-12-02 DIAGNOSIS — L989 Disorder of the skin and subcutaneous tissue, unspecified: Secondary | ICD-10-CM

## 2023-12-02 MED ORDER — TIRZEPATIDE-WEIGHT MANAGEMENT 10 MG/0.5ML ~~LOC~~ SOLN: 10.0000 mg | SUBCUTANEOUS | 2 refills | Status: DC

## 2023-12-02 MED ORDER — ZEPBOUND 10 MG/0.5ML ~~LOC~~ SOAJ: 10.0000 mg | SUBCUTANEOUS | 0 refills | Status: DC

## 2023-12-02 NOTE — Telephone Encounter (Signed)
 Can we get a prior auth on tirzepatide  10 MG/0.5ML injection vial ?

## 2023-12-02 NOTE — Patient Instructions (Signed)
 It was great to see you!  I have placed a referral to dermatology, they will call   We will see if insurance covers zepbound    Let's follow-up in 3 months, sooner if you have concerns.  If a referral was placed today, you will be contacted for an appointment. Please note that routine referrals can sometimes take up to 3-4 weeks to process. Please call our office if you haven't heard anything after this time frame.  Take care,  Tinnie Harada, NP

## 2023-12-02 NOTE — Progress Notes (Signed)
 Established Patient Office Visit  Subjective   Patient ID: Kathryn Fleming, female    DOB: February 23, 1990  Age: 33 y.o. MRN: [id number]  Chief Complaint  Patient presents with   Weight Management    Follow up, request a referral to dermatology for spot on right side of forehead, Rx Refill    HPI Discussed the use of AI scribe software for clinical note transcription with the patient, who gave verbal consent to proceed.  History of Present Illness   Kathryn Fleming is a 33 year old female who presents for weight management and evaluation of a skin lesion.  She has lost 38 pounds since February using Zepbound  at a 10 mg dose, which reduces her appetite. She experiences occasional diarrhea when her diet is not optimal and has noticed hair loss, likely due to nutritional deficiencies. Her diet focuses on protein intake, including protein shakes, cheese, and smoothies, with fruits like apples and occasional peanut butter covered bananas. She aims to increase vegetable consumption and maintains good hydration. Her physical activity includes 10,000 to 12,000 steps daily and rebounding exercises on a trampoline. She is concerned about maintaining her progress during upcoming travel to [place] and the holiday season.  A skin lesion has been present for years on her forehead and is slightly changing. It is irregular, located in a sun-exposed area, and does not itch or hurt. The lesion has not changed color.       ROS See pertinent positives and negatives per HPI.    Objective:     BP 110/78 (BP Location: Left Arm, Patient Position: Sitting, Cuff Size: Large)   Pulse 85   Temp (!) 97 F (36.1 C)   Ht 5' 6 (1.676 m)   Wt 238 lb (108 kg)   LMP 11/29/2023 (Exact Date)   SpO2 99%   BMI 38.41 kg/m  BP Readings from Last 3 Encounters:  12/02/23 110/78  09/01/23 108/74  06/02/23 110/72   Wt Readings from Last 3 Encounters:  12/02/23 238 lb (108 kg)  09/01/23 245 lb 12.8 oz (111.5 kg)   06/02/23 251 lb (113.9 kg)      Physical Exam Vitals and nursing note reviewed.  Constitutional:      General: She is not in acute distress.    Appearance: Normal appearance.  HENT:     Head: Normocephalic.  Eyes:     Conjunctiva/sclera: Conjunctivae normal.  Cardiovascular:     Rate and Rhythm: Normal rate and regular rhythm.     Pulses: Normal pulses.     Heart sounds: Normal heart sounds.  Pulmonary:     Effort: Pulmonary effort is normal.  Musculoskeletal:     Cervical back: Normal range of motion.  Skin:    General: Skin is warm.     Comments: Small round pearly bump to right forehead  Neurological:     General: No focal deficit present.     Mental Status: She is alert and oriented to person, place, and time.  Psychiatric:        Mood and Affect: Mood normal.        Behavior: Behavior normal.        Thought Content: Thought content normal.        Judgment: Judgment normal.      Assessment & Plan:   Problem List Items Addressed This Visit       Other   Obesity (BMI 30-39.9)   BMI 38.4. She is experiencing gradual weight loss with current medication,  which is effective and has minor side effects. Continue zepbound  10mg  injection weekly. Encourage dietary adjustments focusing on protein and vegetables, along with regular physical activity such as walking and rebounding. Follow-up in 3 months.       Relevant Medications   tirzepatide  10 MG/0.5ML injection vial   tirzepatide  (ZEPBOUND ) 10 MG/0.5ML Pen   Other Visit Diagnoses       Skin lesion    -  Primary   The skin lesion shows slight changes and is located in a sun-exposed area. Referral placed to dermatology.   Relevant Orders   Ambulatory referral to Dermatology       Return in about 3 months (around 03/03/2024) for weight management .    Tinnie DELENA Harada, NP

## 2023-12-02 NOTE — Assessment & Plan Note (Signed)
 BMI 38.4. She is experiencing gradual weight loss with current medication, which is effective and has minor side effects. Continue zepbound  10mg  injection weekly. Encourage dietary adjustments focusing on protein and vegetables, along with regular physical activity such as walking and rebounding. Follow-up in 3 months.

## 2023-12-03 ENCOUNTER — Other Ambulatory Visit (HOSPITAL_COMMUNITY): Payer: Self-pay

## 2023-12-03 ENCOUNTER — Telehealth: Payer: Self-pay

## 2023-12-03 NOTE — Telephone Encounter (Signed)
 Patient notified that Rx is not covered by insurance.

## 2023-12-03 NOTE — Telephone Encounter (Signed)
 Pharmacy Patient Advocate Encounter  Insurance verification completed.   The patient is insured through [facility]   Ran test claim for ZEPBOUND  10MG /0.5ML. Currently The Pts plan only covers this Rx for [facility] and not [facility]. This rx is excluded from the pts plan          This test claim was processed through [facility]- copay amounts may vary at other pharmacies due to pharmacy/plan contracts, or as the patient moves through the different stages of their insurance plan.

## 2023-12-10 ENCOUNTER — Encounter: Payer: Self-pay | Admitting: Nurse Practitioner

## 2023-12-10 MED ORDER — SCOPOLAMINE 1 MG/3DAYS TD PT72: 1.0000 | MEDICATED_PATCH | TRANSDERMAL | 0 refills | Status: AC

## 2024-03-03 ENCOUNTER — Encounter: Payer: Self-pay | Admitting: Nurse Practitioner

## 2024-03-03 ENCOUNTER — Ambulatory Visit: Admitting: Nurse Practitioner

## 2024-03-03 VITALS — BP 108/72 | HR 102 | Temp 97.1°F | Ht 66.0 in | Wt 230.4 lb

## 2024-03-03 DIAGNOSIS — Z6837 Body mass index (BMI) 37.0-37.9, adult: Secondary | ICD-10-CM

## 2024-03-03 DIAGNOSIS — E890 Postprocedural hypothyroidism: Secondary | ICD-10-CM | POA: Diagnosis not present

## 2024-03-03 DIAGNOSIS — E669 Obesity, unspecified: Secondary | ICD-10-CM | POA: Diagnosis not present

## 2024-03-03 MED ORDER — ZEPBOUND 12.5 MG/0.5ML ~~LOC~~ SOLN: 12.5000 mg | SUBCUTANEOUS | 2 refills | Status: AC

## 2024-03-03 MED ORDER — SYNTHROID 200 MCG PO TABS: 200.0000 ug | ORAL_TABLET | Freq: Every day | ORAL | 3 refills | Status: AC

## 2024-03-03 NOTE — Progress Notes (Signed)
 "   Established Patient Office Visit Subjective:    Patient ID: Kathryn Fleming, female    DOB: 04-Aug-1990, 34 y.o.   MRN: [id number]  Chief Complaint Chief Complaint  Patient presents with   Weight Management    Follow up, wants to discuss increasing medication     HPI Kathryn Fleming is a 34 y.o. female who presents for 57-month follow up for weight loss management.  Discussed the use of AI scribe software for clinical note transcription with the patient, who gave verbal consent to proceed.  She notes a weight-loss plateau for the last few months, with weight stable at 230 to 235 pounds after prior loss from the 240s, and she had hoped to reach the 220s by this visit. She links the plateau to recent travel and holidays.  She takes zepbound  10 mg weekly, which effectively suppresses appetite without reported side effects. She stays physically active, meets her step goals, and has lost 46 pounds over the past year.  She eats smaller portions and healthier foods, sometimes ordering kids meals when eating out. She is working on leaving food uneaten to avoid overeating and has increased water intake while choosing lower-calorie beverages.     Review of Systems See pertinent positives and negatives per HPI.     Objective:    BP 108/72 (BP Location: Left Arm, Patient Position: Sitting, Cuff Size: Large)   Pulse (!) 102   Temp (!) 97.1 F (36.2 C)   Ht 5' 6 (1.676 m)   Wt 230 lb 6.4 oz (104.5 kg)   LMP 02/18/2024 (Exact Date)   SpO2 98%   BMI 37.19 kg/m   BP Readings from Last 3 Encounters:  03/03/24 108/72  12/02/23 110/78  09/01/23 108/74   Wt Readings from Last 3 Encounters:  03/03/24 230 lb 6.4 oz (104.5 kg)  12/02/23 238 lb (108 kg)  09/01/23 245 lb 12.8 oz (111.5 kg)   Physical Exam Vitals and nursing note reviewed.  Constitutional:      General: She is not in acute distress.    Appearance: Normal appearance.  HENT:     Head: Normocephalic.  Eyes:      Conjunctiva/sclera: Conjunctivae normal.  Cardiovascular:     Rate and Rhythm: Normal rate and regular rhythm.     Pulses: Normal pulses.     Heart sounds: Normal heart sounds.  Pulmonary:     Effort: Pulmonary effort is normal.     Breath sounds: Normal breath sounds.  Musculoskeletal:     Cervical back: Normal range of motion.  Skin:    General: Skin is warm.  Neurological:     General: No focal deficit present.     Mental Status: She is alert and oriented to person, place, and time.  Psychiatric:        Mood and Affect: Mood normal.        Behavior: Behavior normal.        Thought Content: Thought content normal.        Judgment: Judgment normal.       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 Currently on Synthroid  with normal thyroid  levels. Continue Synthroid  200mcg dose.      Relevant Medications   SYNTHROID  200 MCG tablet     Other   Obesity (BMI 30-39.9)   BMI 37.2. Starting  BMI 44.6 on 02/17/23. Her weight has plateaued at 230-235 lbs. Current medication is well-tolerated. Increased zepbound  to 12.5 mg weekly. Monitor weight and adjust dosage as needed, with a potential increase to 15 mg after one month. Continue regular physical activity and dietary modifications. Scheduled follow-up in three months.       Relevant Medications   Tirzepatide -Weight Management (ZEPBOUND ) 12.5 MG/0.5ML SOLN    Kathryn Fleming DELENA Harada, NP   I,Emily Lagle,acting as a scribe for [employer], NP.,have documented all relevant documentation on the behalf of Kathryn Fleming DELENA Harada, NP.  I, Kathryn DELENA Harada, NP, have reviewed all documentation for this visit. The documentation on 03/03/2024 for the exam, diagnosis, procedures, and orders are all accurate and complete. "

## 2024-03-03 NOTE — Assessment & Plan Note (Signed)
 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 Currently on Synthroid  with normal thyroid  levels. Continue Synthroid  200mcg dose.

## 2024-03-03 NOTE — Assessment & Plan Note (Addendum)
 BMI 37.2. Starting BMI 44.6 on 02/17/23. Her weight has plateaued at 230-235 lbs. Current medication is well-tolerated. Increased zepbound  to 12.5 mg weekly. Monitor weight and adjust dosage as needed, with a potential increase to 15 mg after one month. Continue regular physical activity and dietary modifications. Scheduled follow-up in three months.

## 2024-03-09 ENCOUNTER — Other Ambulatory Visit: Payer: Self-pay | Admitting: Nurse Practitioner

## 2024-03-09 NOTE — Telephone Encounter (Signed)
 Requesting: ZEPBOUND  10 MG/0.5ML SUBCUTANEOUS SOLUTION  Last Visit: 03/03/2024 Next Visit: Visit date not found Last Refill: The original prescription was discontinued on 03/03/2024 by Nedra Tinnie LABOR, NP. Renewing this prescription may not be appropriate. Rx was increased to 12.5mg   Please Advise
